# Patient Record
Sex: Female | Born: 1965 | Marital: Single | Smoking: Never smoker
Health system: Southern US, Community
[De-identification: ages and names within clinical notes are randomized; demographics above are authoritative.]

## PROBLEM LIST (undated history)

## (undated) DIAGNOSIS — R011 Cardiac murmur, unspecified: Secondary | ICD-10-CM

## (undated) DIAGNOSIS — I421 Obstructive hypertrophic cardiomyopathy: Secondary | ICD-10-CM

## (undated) DIAGNOSIS — I700 Atherosclerosis of aorta: Secondary | ICD-10-CM

## (undated) DIAGNOSIS — I10 Essential (primary) hypertension: Secondary | ICD-10-CM

## (undated) DIAGNOSIS — K5792 Diverticulitis of intestine, part unspecified, without perforation or abscess without bleeding: Secondary | ICD-10-CM

## (undated) HISTORY — DX: Cardiac murmur, unspecified: R01.1

## (undated) HISTORY — PX: BARTHOLIN CYST MARSUPIALIZATION: SHX5383

## (undated) HISTORY — PX: TUBAL LIGATION: SHX77

## (undated) HISTORY — DX: Atherosclerosis of aorta: I70.0

## (undated) HISTORY — DX: Essential (primary) hypertension: I10

## (undated) HISTORY — DX: Diverticulitis of intestine, part unspecified, without perforation or abscess without bleeding: K57.92

---

## 1997-05-24 ENCOUNTER — Other Ambulatory Visit: Admission: RE | Admit: 1997-05-24 | Discharge: 1997-05-24 | Payer: Self-pay | Admitting: Gynecology

## 1999-07-03 ENCOUNTER — Emergency Department (HOSPITAL_COMMUNITY): Admission: EM | Admit: 1999-07-03 | Discharge: 1999-07-03 | Payer: Self-pay

## 2000-05-11 ENCOUNTER — Inpatient Hospital Stay (HOSPITAL_COMMUNITY): Admission: AD | Admit: 2000-05-11 | Discharge: 2000-05-14 | Payer: Self-pay | Admitting: Gynecology

## 2000-05-11 ENCOUNTER — Encounter (INDEPENDENT_AMBULATORY_CARE_PROVIDER_SITE_OTHER): Payer: Self-pay

## 2000-06-22 ENCOUNTER — Other Ambulatory Visit: Admission: RE | Admit: 2000-06-22 | Discharge: 2000-06-22 | Payer: Self-pay | Admitting: Gynecology

## 2001-07-11 ENCOUNTER — Other Ambulatory Visit: Admission: RE | Admit: 2001-07-11 | Discharge: 2001-07-11 | Payer: Self-pay | Admitting: Gynecology

## 2002-07-20 ENCOUNTER — Other Ambulatory Visit: Admission: RE | Admit: 2002-07-20 | Discharge: 2002-07-20 | Payer: Self-pay | Admitting: Gynecology

## 2002-12-09 ENCOUNTER — Emergency Department (HOSPITAL_COMMUNITY): Admission: EM | Admit: 2002-12-09 | Discharge: 2002-12-09 | Payer: Self-pay | Admitting: Emergency Medicine

## 2003-08-05 ENCOUNTER — Other Ambulatory Visit: Admission: RE | Admit: 2003-08-05 | Discharge: 2003-08-05 | Payer: Self-pay | Admitting: Gynecology

## 2004-08-13 ENCOUNTER — Other Ambulatory Visit: Admission: RE | Admit: 2004-08-13 | Discharge: 2004-08-13 | Payer: Self-pay | Admitting: Gynecology

## 2005-07-08 ENCOUNTER — Emergency Department (HOSPITAL_COMMUNITY): Admission: EM | Admit: 2005-07-08 | Discharge: 2005-07-08 | Payer: Self-pay | Admitting: Family Medicine

## 2005-08-17 ENCOUNTER — Other Ambulatory Visit: Admission: RE | Admit: 2005-08-17 | Discharge: 2005-08-17 | Payer: Self-pay | Admitting: Gynecology

## 2006-08-19 ENCOUNTER — Other Ambulatory Visit: Admission: RE | Admit: 2006-08-19 | Discharge: 2006-08-19 | Payer: Self-pay | Admitting: Gynecology

## 2010-05-29 NOTE — Discharge Summary (Signed)
[facility]  Patient:    [name], Judy                       MRN: [id number] Adm. Date:  [id number] Disc. Date: [id number] Attending:  Tonye Royalty Dictator:   Antony Contras, [facility]                           Discharge Summary  DISCHARGE DIAGNOSES:          1. Intrauterine pregnancy at term.                               2. History of previous cesarean section.                               3. Undesired fertility.  PROCEDURES:                   Repeat low cervical transverse cesarean section                               and tubal sterilization, bilateral Pomeroy                               technique.  HISTORY OF PRESENT ILLNESS:   The patient is a 45 year old gravida 5, para 2-0-2-2, with an LMP of August 13, 1999 and [facility] of May 19, 2000. 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  PRENATAL LABORATORY DATA:     Blood type B positive, antibody screen negative. Sickle cell negative. RPR, HBsAg, HIV nonreactive. Rubella equivocal. MSAFP normal.  HOSPITAL COURSE/TREATMENT:    The patient was admitted on May 11, 2000 for a low cervical transverse cesarean section and bilateral tubal sterilization. It was performed by Dr. Lily Peer assisted by Dr. Audie Box. The patient delivered an Apgar 8/9 female infant weighing 8 pounds 2 ounces.  Postpartum course:  The patient remained afebrile and had no difficulty voiding. She was able to be discharged in satisfactory condition on her third postoperative day. She did receive rubella vaccine prior to discharge. CBC: Hematocrit 30.5, hemoglobin 10.5, WBC 13.8, platelets 252,000.  DISCHARGE FOLLOWUP:           The patient is to follow up in six weeks.  DISCHARGE MEDICATIONS:        The patient is to continue prenatal vitamins and iron with Motrin and Tylox for  pain. DD:  05/30/00 TD:  05/30/00 Job: [id number]

## 2010-05-29 NOTE — H&P (Signed)
[facility]  Patient:    Judy [name], Judy [name]                         MRN: [id number] Adm. Date:  05/11/00 Attending:  Gaetano Hawthorne. Lily Peer, M.D.                         History and Physical  CHIEF COMPLAINT:              1. Term intrauterine pregnancy at 39 weeks as                                  the mean gestational age.                               2. Two previous cesarean sections for elective                                  repeat.                               3. Request for elective permanent sterilization.  HISTORY:                      The patient is a 45 year old gravida 5, para 2, AB2 with an estimated date of confinement of May 29, 2000.  Patient will be 39 weeks estimated gestational age at the time of her planned scheduled cesarean section.  Patient has had two previous cesarean sections in 1991 and 1998 and during her prenatal visit she also had requested to proceed with elective permanent sterilization which she is aware she will not be able to have any more children.  She also had a right Bartholins cyst that was drained during this pregnancy as well.  She also had anemia during this pregnancy and was supplemented with iron.  Otherwise, she had an uneventful prenatal course.  ALLERGIES:                    ANAPROX.  PAST OBSTETRICAL HISTORY:     She has had two previous cesarean sections and also had a spontaneous AB.  She had excision of right Bartholins cyst during this pregnancy.  REVIEW OF SYSTEMS:            See hospital form.  PRENATAL LABORATORIES:        B+ blood type.  Negative antibody screen. Sickle cell trait was negative.  VDRL was nonreactive.  Hepatitis B surface antigen was negative.  HIV was negative.  Rubella was equivocal.  Pap smear was normal.  Alpha fetoprotein was normal.  Blood sugar screen was normal. GBS culture was negative, although she had positive group B strep last pregnancy.  PHYSICAL  EXAMINATION  GENERAL:                      Well-developed, well-nourished female.  HEENT:                        Unremarkable.  NECK:  Supple.  Trachea:  Midline.  No carotid bruits. No thyromegaly.  LUNGS:                        Clear to auscultation without rhonchi or wheezes.  HEART:                        Regular rate and rhythm.  No murmurs or gallops.  BREASTS:                      Done during the first prenatal visit was reported to be normal.  ABDOMEN:                      Gravid uterus.  Fundal height 39.5 cm.  Positive fetal heart tones.  Vertex presentation.  PELVIC:                       Cervix:  Long, closed, posterior.  EXTREMITIES:                  DTRs 1+.  Negative clonus.  ASSESSMENT:                   A 45 year old gravida 5, para 2, AB2 at 31 weeks estimated gestational age with two previous cesarean sections for elective repeat along with elective permanent sterilization.  The patient is fully aware she will not be able to have any more children.  Potential risks of the operation to include infection, bleeding, trauma to internal organs.  All these issues were discussed with the patient.  She is fully aware and would like to proceed with the above mentioned operations.  All questions were answered and will follow accordingly.  PLAN:                         As per assessment above. DD:  05/10/00 TD:  05/10/00 Job: [id number]

## 2010-05-29 NOTE — Op Note (Signed)
[facility]  Patient:    Judy, [name]                       MRN: [id number] Proc. Date: 05/11/00 Adm. Date:  [id number] Attending:  Tonye Royalty                           Operative Report  PREOPERATIVE DIAGNOSES:       1. Term intrauterine pregnancy.                               2. Previous cesarean section.                               3. Request for elective repeat cesarean section.                               4. Request for elective permanent sterilization.  POSTOPERATIVE DIAGNOSES:      1. Term intrauterine pregnancy.                               2. Previous cesarean section.                               3. Request for elective repeat cesarean section.                               4. Request for elective permanent sterilization.  PROCEDURES PERFORMED:         1. Repeat lower uterine segment transverse                                  cesarean section.                               2. Bilateral tubal sterilization procedure,                                  Pomeroy technique.  SURGEON:                      Juan H. Lily Peer, M.D.  ASSISTANTMarcial Pacas P. Fontaine, M.D.  ANESTHESIA:                   Spinal.  INDICATIONS FOR OPERATION:    The patient is a 45 year old gravida 5, para 2, AB2 at 31 weeks estimated gestational age with two previous cesarean sections requesting elective repeat.  Also requests elective permanent sterilization.  DESCRIPTION OF PROCEDURE:     After the patient was adequately counseled, she was taken to the operating room, where she underwent successful spinal placement.  The abdomen was prepped and draped in the usual sterile fashion. A Foley catheter was placed to monitor urinary output.  A Pfannenstiel skin incision  was made adjacent to the previous Pfannenstiel scar.  The incision was carried down through the skin and subcutaneous tissue down to the rectus fascia, whereby a  midline nick was made.  The fascia was incised in a transverse fashion.  The midline raphe was entered.  The peritoneal cavity was entered.  A bladder flap was established.  The lower uterine segment was incised in a transverse fashion.  Clear amniotic fluid was present.  The newborn was delivered.  The cord was doubly clamped and excised.  The nasopharyngeal area was bulb suctioned with a bulb suction.  The newborn gave an immediate cry, was shown to the parents and passed off to the pediatricians who were in attendance, who gave the female infant Apgars of 8 and 9, a weight of 8 lb 2 oz.  Arterial cord pH was 7.28.  Once the cord blood was obtained, the placenta was delivered from the intrauterine cavity.  The uterus was then exteriorized.  The lower uterine segment was closed with a running stitch of 0 Vicryl suture.  Attention was then turned to the proximal portion of the left fallopian tube, where the proximal 1/3 portion was transected after suture ligating it x 2 with 3-0 plain catgut suture.  A similar procedure was carried out on the contralateral side.  The uterus was placed back into the abdominal cavity and, after ascertaining adequate hemostasis, the visceral peritoneum was reapproximated.  The rectus fascia was closed with a running stitch of 0 Vicryl suture.  Subcutaneous bleeders were Bovie cauterized.  The skin was reapproximated with skin clips, followed by placement of Xeroform gauze and 4 x 4 dressing.  The patient was transferred to the recovery room with stable vital signs.  Blood loss for the procedure was 400 cc.  Fluid resuscitation consisted of 3000 cc of lactated Ringers.  Of note, the patient did receive 1 g of Cefotan after the cord had been clamped during the case.  The patient tolerated the procedure well and was transferred to the recovery room with stable vital signs. DD:  05/11/00 TD:  05/11/00 Job: [id number]

## 2010-10-27 ENCOUNTER — Other Ambulatory Visit (HOSPITAL_COMMUNITY)
Admission: RE | Admit: 2010-10-27 | Discharge: 2010-10-27 | Disposition: A | Discharge status: Home / Self Care | Payer: BC Managed Care – PPO | Source: Ambulatory Visit | Attending: Gynecology | Admitting: Gynecology

## 2010-10-27 ENCOUNTER — Ambulatory Visit (INDEPENDENT_AMBULATORY_CARE_PROVIDER_SITE_OTHER): Payer: BC Managed Care – PPO | Admitting: Gynecology

## 2010-10-27 ENCOUNTER — Encounter: Payer: Self-pay | Admitting: Gynecology

## 2010-10-27 VITALS — BP 130/70 | Ht 66.0 in | Wt 220.0 lb

## 2010-10-27 DIAGNOSIS — R823 Hemoglobinuria: Secondary | ICD-10-CM

## 2010-10-27 DIAGNOSIS — Z01419 Encounter for gynecological examination (general) (routine) without abnormal findings: Secondary | ICD-10-CM

## 2010-10-27 DIAGNOSIS — N951 Menopausal and female climacteric states: Secondary | ICD-10-CM

## 2010-10-27 DIAGNOSIS — R3915 Urgency of urination: Secondary | ICD-10-CM

## 2010-10-27 NOTE — Progress Notes (Signed)
Judy [name] March 14, 1965 [id number]        45 y.o.  for annual exam.  Overall doing well has not been in for several years. She does note some mild urgency with urination no incontinence has been going on over the last year or 2. Also notes some occasional night sweats no real hot flashes during the day.  Past medical history,surgical history, medications, allergies, family history and social history were all reviewed and documented in the EPIC chart. ROS:  Was performed and pertinent positives and negatives are included in the history.  Exam: chaperone present Filed Vitals:   10/27/10 1142  BP: 130/70   General appearance  Normal Skin grossly normal Head/Neck normal with no cervical or supraclavicular adenopathy thyroid normal Lungs  clear Cardiac RR, without RMG Abdominal  soft, nontender, without masses, organomegaly or hernia Breasts  examined lying and sitting without masses, retractions, discharge or axillary adenopathy. Pelvic  Ext/BUS/vagina  normal   Cervix  normal  Pap done  Uterus  anteverted, normal size, shape and contour, midline and mobile nontender   Adnexa  Without masses or tenderness    Anus and perineum  normal   Rectovaginal  normal sphincter tone without palpated masses or tenderness.    Assessment/Plan:  45 y.o. female for annual exam.    1. Night sweats. Occasional in nature. We'll check baseline TSH FSH assuming normal patient's to monitor presents as long as they remain stable or resolved we'll follow if they were to worsen then we'll refer to primary for further evaluation. 2. Urgency. Patient has mild urgency symptoms no other symptoms to suggest IC such as dysuria or abdominal discomfort. We'll check UA assuming negative we'll follow up present.  I suggested dietary changes such as avoiding caffeine and carbonated beverages.  I discussed options for medications such as Enablex but she is not interested due to the side effect profile. 3. Health maintenance.  Self breast exams on a monthly basis discussed encourage never had mammo-strongly urged her to schedule this and she agrees to arrange this. She had cholesterol and glucose done at health screening at work. I will check a baseline CBC along with the TSH [facility] and urinalysis. Assuming she continues well from a gynecologic standpoint and she'll see [place] in a year sooner as needed.    Dara Lords MD, 12:20 PM 10/27/2010

## 2010-10-27 NOTE — Progress Notes (Signed)
Addended byCammie Mcgee T on: 10/27/2010 12:43 PM   Modules accepted: Orders

## 2012-04-14 ENCOUNTER — Other Ambulatory Visit (HOSPITAL_COMMUNITY)
Admission: RE | Admit: 2012-04-14 | Discharge: 2012-04-14 | Disposition: A | Discharge status: Home / Self Care | Payer: Self-pay | Source: Ambulatory Visit | Attending: Gynecology | Admitting: Gynecology

## 2012-04-14 ENCOUNTER — Encounter: Payer: Self-pay | Admitting: Gynecology

## 2012-04-14 ENCOUNTER — Ambulatory Visit (INDEPENDENT_AMBULATORY_CARE_PROVIDER_SITE_OTHER): Payer: BC Managed Care – PPO | Admitting: Gynecology

## 2012-04-14 VITALS — BP 130/84 | Ht 66.0 in | Wt 209.0 lb

## 2012-04-14 DIAGNOSIS — Z01419 Encounter for gynecological examination (general) (routine) without abnormal findings: Secondary | ICD-10-CM

## 2012-04-14 DIAGNOSIS — Z1151 Encounter for screening for human papillomavirus (HPV): Secondary | ICD-10-CM | POA: Insufficient documentation

## 2012-04-14 DIAGNOSIS — N951 Menopausal and female climacteric states: Secondary | ICD-10-CM

## 2012-04-14 DIAGNOSIS — Z13220 Encounter for screening for lipoid disorders: Secondary | ICD-10-CM

## 2012-04-14 LAB — CBC WITH DIFFERENTIAL/PLATELET
Basophils Relative: 0 % (ref 0–1)
Eosinophils Absolute: 0.1 10*3/uL (ref 0.0–0.7)
Eosinophils Relative: 2 % (ref 0–5)
Hemoglobin: 11.1 g/dL — ABNORMAL LOW (ref 12.0–15.0)
MCH: 29.2 pg (ref 26.0–34.0)
MCHC: 33 g/dL (ref 30.0–36.0)
Monocytes Absolute: 0.4 10*3/uL (ref 0.1–1.0)
Monocytes Relative: 5 % (ref 3–12)
Neutrophils Relative %: 55 % (ref 43–77)

## 2012-04-14 NOTE — Patient Instructions (Signed)
Blood pressure 130/84  Call to Schedule your mammogram  Facilities in [place]: 1)  [facility] [place], [address] Phone: [phone] 2)  [facility], [id number] Phone: [phone] 3)  Dr. Yolanda Bonine at [facility] [address] Phone: [phone]     Mammogram A mammogram is an X-ray test to find changes in a woman's breast. You should get a mammogram if:  You are 30 years of age or older  You have risk factors.   Your doctor recommends that you have one.  BEFORE THE TEST  Do not schedule the test the week before your period, especially if your breasts are sore during this time.  On the day of your mammogram:  Wash your breasts and armpits well. After washing, do not put on any deodorant or talcum powder on until after your test.   Eat and drink as you usually do.   Take your medicines as usual.   If you are diabetic and take insulin, make sure you:   Eat before coming for your test.   Take your insulin as usual.   If you cannot keep your appointment, call before the appointment to cancel. Schedule another appointment.  TEST  You will need to undress from the waist up. You will put on a hospital gown.   Your breast will be put on the mammogram machine, and it will press firmly on your breast with a piece of plastic called a compression paddle. This will make your breast flatter so that the machine can X-ray all parts of your breast.   Both breasts will be X-rayed. Each breast will be X-rayed from above and from the side. An X-ray might need to be taken again if the picture is not good enough.   The mammogram will last about 15 to 30 minutes.  AFTER THE TEST Finding out the results of your test Ask when your test results will be ready. Make sure you get your test results.  Document Released: 03/26/2008 Document Revised: 12/17/2010 Document Reviewed:  03/26/2008 [facility] Patient Information 2012 [place], [place].

## 2012-04-14 NOTE — Addendum Note (Signed)
Addended by: Dayna Barker on: 04/14/2012 03:22 PM   Modules accepted: Orders

## 2012-04-14 NOTE — Progress Notes (Signed)
Judy [name] [birth year] [id number]        47 y.o.  [id number] for annual exam.  Several issues noted below.  Past medical history,surgical history, medications, allergies, family history and social history were all reviewed and documented in the EPIC chart. ROS:  Was performed and pertinent positives and negatives are included in the history.  Exam: Kim assistant Filed Vitals:   04/14/12 1437  BP: 130/84  Height: 5\' 6"  (1.676 m)  Weight: 209 lb (94.802 kg)   General appearance  Normal Skin grossly normal Head/Neck normal with no cervical or supraclavicular adenopathy thyroid normal Lungs  clear Cardiac RR, without RMG Abdominal  soft, nontender, without masses, organomegaly or hernia Breasts  examined lying and sitting without masses, retractions, discharge or axillary adenopathy. Pelvic  Ext/BUS/vagina  normal   Cervix  normal   Uterus  axial to anteverted, normal size, shape and contour, midline and mobile nontender   Adnexa  Without masses or tenderness    Anus and perineum  normal   Rectovaginal  normal sphincter tone without palpated masses or tenderness.    Assessment/Plan:  47 y.o. [id number] female for annual exam, BTL contraception.   1. Irregular last cycle. Patient's last menses lasted longer with heavier bleeding and cramping. The remainder of her menses have all been normal. Exam today is normal.  We'll check TSH otherwise keep menstrual calendar. As long as her menses continue regular we will monitor. If irregularity continues she will call and we will start with sonohysterogram to rule out intracavitary abnormalities such as polyps and myomas. 2. Menopausal symptoms. Patient has occasional night sweats. Will check TSH. Also old on [facility] if she is having monthly menses and anticipate this will be normal. 3. Pap smear 2012. Pap/HPV done today. No history of abnormal Pap smears previously. If this one is normal then we'll plan less frequent screening interval. 4. Mammography  never. Strongly urged her to schedule mammography. The benefits of early detection reviewed. Patient agrees to schedule. SBE monthly reviewed. 5. Health maintenance. Blood pressure mildly elevated at 130/84. I have asked her to have this rechecked in a non exam situation.  Check baseline CBC comprehensive metabolic panel lipid profile urinalysis along with her TSH. Assuming her menses regulated she will see [place] in a year., Sooner as needed.  Dara Lords MD, 2:58 PM 04/14/2012

## 2012-04-15 LAB — URINALYSIS W MICROSCOPIC + REFLEX CULTURE
Bacteria, UA: NONE SEEN
Casts: NONE SEEN
Hgb urine dipstick: NEGATIVE
Ketones, ur: NEGATIVE mg/dL
Nitrite: NEGATIVE
Urobilinogen, UA: 1 mg/dL (ref 0.0–1.0)

## 2012-04-15 LAB — LIPID PANEL
HDL: 48 mg/dL (ref >39)
LDL Cholesterol: 107 mg/dL — ABNORMAL HIGH (ref 0–99)
Total CHOL/HDL Ratio: 3.5 Ratio
Triglycerides: 53 mg/dL (ref <150)
VLDL: 11 mg/dL (ref 0–40)

## 2012-04-15 LAB — COMPREHENSIVE METABOLIC PANEL
Alkaline Phosphatase: 71 U/L (ref 39–117)
BUN: 10 mg/dL (ref 6–23)
Creat: 0.9 mg/dL (ref 0.50–1.10)
Glucose, Bld: 83 mg/dL (ref 70–99)
Sodium: 138 mEq/L (ref 135–145)
Total Bilirubin: 0.4 mg/dL (ref 0.3–1.2)

## 2012-04-15 LAB — TSH: TSH: 1.756 u[IU]/mL (ref 0.350–4.500)

## 2012-04-17 ENCOUNTER — Other Ambulatory Visit: Payer: Self-pay | Admitting: Gynecology

## 2012-04-17 DIAGNOSIS — D649 Anemia, unspecified: Secondary | ICD-10-CM

## 2012-07-08 ENCOUNTER — Emergency Department (HOSPITAL_BASED_OUTPATIENT_CLINIC_OR_DEPARTMENT_OTHER)
Admission: EM | Admit: 2012-07-08 | Discharge: 2012-07-08 | Disposition: A | Discharge status: Home / Self Care | Payer: BC Managed Care – PPO | Attending: Emergency Medicine | Admitting: Emergency Medicine

## 2012-07-08 ENCOUNTER — Encounter (HOSPITAL_BASED_OUTPATIENT_CLINIC_OR_DEPARTMENT_OTHER): Payer: Self-pay | Admitting: *Deleted

## 2012-07-08 DIAGNOSIS — R5381 Other malaise: Secondary | ICD-10-CM | POA: Insufficient documentation

## 2012-07-08 DIAGNOSIS — J069 Acute upper respiratory infection, unspecified: Secondary | ICD-10-CM | POA: Insufficient documentation

## 2012-07-08 DIAGNOSIS — R6889 Other general symptoms and signs: Secondary | ICD-10-CM | POA: Insufficient documentation

## 2012-07-08 DIAGNOSIS — J3489 Other specified disorders of nose and nasal sinuses: Secondary | ICD-10-CM | POA: Insufficient documentation

## 2012-07-08 DIAGNOSIS — R059 Cough, unspecified: Secondary | ICD-10-CM | POA: Insufficient documentation

## 2012-07-08 DIAGNOSIS — J029 Acute pharyngitis, unspecified: Secondary | ICD-10-CM | POA: Insufficient documentation

## 2012-07-08 DIAGNOSIS — R0982 Postnasal drip: Secondary | ICD-10-CM | POA: Insufficient documentation

## 2012-07-08 DIAGNOSIS — R05 Cough: Secondary | ICD-10-CM | POA: Insufficient documentation

## 2012-07-08 DIAGNOSIS — R51 Headache: Secondary | ICD-10-CM | POA: Insufficient documentation

## 2012-07-08 MED ORDER — HYDROCODONE-ACETAMINOPHEN 7.5-325 MG/15ML PO SOLN: 15.0000 mL | Freq: Four times a day (QID) | ORAL | Status: DC | PRN

## 2012-07-08 MED ORDER — AMOXICILLIN 500 MG PO CAPS: 500.0000 mg | ORAL_CAPSULE | Freq: Three times a day (TID) | ORAL | Status: DC

## 2012-07-08 MED ORDER — HYDROCODONE-ACETAMINOPHEN 7.5-325 MG/15ML PO SOLN
10.0000 mL | Freq: Once | ORAL | Status: AC
  Administered 2012-07-08: 10 mL via ORAL
  Filled 2012-07-08: qty 15

## 2012-07-08 NOTE — ED Notes (Signed)
Sore throat and URI sx since tuesday

## 2012-07-08 NOTE — ED Provider Notes (Signed)
History    CSN: [id number] Arrival date & time 07/08/12  2128  First MD Initiated Contact with Patient 07/08/12 2202     Chief Complaint  Patient presents with  . Sore Throat   (Consider location/radiation/quality/duration/timing/severity/associated sxs/prior Treatment) HPI Judy [name] is a 47 y.o. female who presents to ED with complaint of sore throat. State symptoms began 4 days ago. Reports nasal congestion, headache, sore throat, dry cough. States no fever. States has been taking over the counter cold medication with no improvement. States sore throat is worsening and now it is her main complaint. States painful to swallow. Denies change of voice. States able to still swallow, just hurts. No sick contacts.    History reviewed. No pertinent past medical history. Past Surgical History  Procedure Laterality Date  . Bartholin cyst marsupialization    . Cesarean section      X3 with BTL  . Tubal ligation     Family History  Problem Relation Age of Onset  . Heart disease Mother   . Diabetes Father    History  Substance Use Topics  . Smoking status: Never Smoker   . Smokeless tobacco: Never Used  . Alcohol Use: No   OB History   Grav Para Term Preterm Abortions TAB SAB Ect Mult Living   5 3 3  2  2   3      Review of Systems  Constitutional: Positive for fatigue. Negative for fever and chills.  HENT: Positive for congestion, sore throat, sneezing, postnasal drip and sinus pressure. Negative for trouble swallowing, neck pain, neck stiffness and voice change.   Respiratory: Negative.   Cardiovascular: Negative.   Genitourinary: Negative for dysuria.  Musculoskeletal: Negative.   Skin: Negative for wound.  Neurological: Negative for weakness and headaches.    Allergies  Naproxen sodium  Home Medications  No current outpatient prescriptions on file. BP 159/77  Pulse 77  Temp(Src) 98.7 F (37.1 C) (Oral)  Ht 5\' 7"  (1.702 m)  Wt 210 lb (95.255 kg)  BMI 32.88  kg/m2  SpO2 100%  LMP 06/29/2012 Physical Exam  Nursing note and vitals reviewed. Constitutional: She appears well-developed and well-nourished. No distress.  HENT:  Head: Normocephalic.  Right Ear: Tympanic membrane, external ear and ear canal normal.  Left Ear: Tympanic membrane, external ear and ear canal normal.  Nose: Rhinorrhea present. Right sinus exhibits no maxillary sinus tenderness and no frontal sinus tenderness. Left sinus exhibits no maxillary sinus tenderness and no frontal sinus tenderness.  Mouth/Throat: Uvula is midline and mucous membranes are normal. Posterior oropharyngeal erythema present. No oropharyngeal exudate.  Eyes: Conjunctivae are normal.  Neck: Neck supple.  Cardiovascular: Normal rate, regular rhythm and normal heart sounds.   Pulmonary/Chest: Effort normal and breath sounds normal. No respiratory distress. She has no wheezes. She has no rales.  Abdominal: Soft. Bowel sounds are normal. She exhibits no distension. There is no tenderness. There is no rebound.  Musculoskeletal: She exhibits no edema.  Neurological: She is alert.  Skin: Skin is warm and dry.  Psychiatric: She has a normal mood and affect. Her behavior is normal.    ED Course  Procedures (including critical care time) Results for orders placed during the hospital encounter of 07/08/12  RAPID STREP SCREEN      Result Value Range   Streptococcus, Group A Screen (Direct) NEGATIVE  NEGATIVE   No results found.   1. URI (upper respiratory infection)   2. Pharyngitis     MDM  Pt with URI symptoms and worsening sore throat. Strep negative. Suspect viral URI. Pt requesting antibiotics, will give prescription, explained most likely not going to help if viral. Advised to hold off for few days. Will treat symptomatically. Pt's oxygen sat 100% on RA, lungs clear, doubt pneumonia. No significant tonsillar swelling or exudate. Doubt bacterial pharyngitis. No signs of peritonsillar absess.   Filed  Vitals:   07/08/12 2146  BP: 159/77  Pulse: 77  Temp: 98.7 F (37.1 C)  TempSrc: Oral  Height: 5\' 7"  (1.702 m)  Weight: 210 lb (95.255 kg)  SpO2: 100%     Myriam Jacobson Pang Robers, PA-C 07/09/12 0033

## 2012-07-08 NOTE — ED Notes (Signed)
C/o sore throat and bilat. ear pain 7/10

## 2012-07-09 NOTE — ED Provider Notes (Signed)
History/physical exam/procedure(s) were performed by non-physician practitioner and as supervising physician I was immediately available for consultation/collaboration. I have reviewed all notes and am in agreement with care and plan.   Lun Muro S Zannah Melucci, MD 07/09/12 1543 

## 2012-07-10 LAB — CULTURE, GROUP A STREP

## 2013-01-25 ENCOUNTER — Ambulatory Visit (INDEPENDENT_AMBULATORY_CARE_PROVIDER_SITE_OTHER): Payer: BC Managed Care – PPO | Admitting: Gynecology

## 2013-01-25 ENCOUNTER — Encounter: Payer: Self-pay | Admitting: Gynecology

## 2013-01-25 ENCOUNTER — Telehealth: Payer: Self-pay

## 2013-01-25 DIAGNOSIS — N926 Irregular menstruation, unspecified: Secondary | ICD-10-CM

## 2013-01-25 DIAGNOSIS — N920 Excessive and frequent menstruation with regular cycle: Secondary | ICD-10-CM

## 2013-01-25 LAB — CBC WITH DIFFERENTIAL/PLATELET
BASOS ABS: 0 10*3/uL (ref 0.0–0.1)
Basophils Relative: 1 % (ref 0–1)
Eosinophils Absolute: 0.2 10*3/uL (ref 0.0–0.7)
Eosinophils Relative: 3 % (ref 0–5)
HEMATOCRIT: 31.9 % — AB (ref 36.0–46.0)
Hemoglobin: 10.7 g/dL — ABNORMAL LOW (ref 12.0–15.0)
LYMPHS ABS: 3.1 10*3/uL (ref 0.7–4.0)
LYMPHS PCT: 41 % (ref 12–46)
MCH: 29.8 pg (ref 26.0–34.0)
MCHC: 33.5 g/dL (ref 30.0–36.0)
MCV: 88.9 fL (ref 78.0–100.0)
Monocytes Absolute: 0.4 10*3/uL (ref 0.1–1.0)
Monocytes Relative: 5 % (ref 3–12)
NEUTROS ABS: 3.8 10*3/uL (ref 1.7–7.7)
Neutrophils Relative %: 50 % (ref 43–77)
PLATELETS: 377 10*3/uL (ref 150–400)
RBC: 3.59 MIL/uL — AB (ref 3.87–5.11)
RDW: 16.5 % — AB (ref 11.5–15.5)
WBC: 7.6 10*3/uL (ref 4.0–10.5)

## 2013-01-25 LAB — FOLLICLE STIMULATING HORMONE: FSH: 10.6 m[IU]/mL

## 2013-01-25 LAB — HCG, SERUM, QUALITATIVE: PREG SERUM: NEGATIVE

## 2013-01-25 MED ORDER — MEGESTROL ACETATE 20 MG PO TABS: 20.0000 mg | ORAL_TABLET | Freq: Every day | ORAL | Status: DC

## 2013-01-25 NOTE — Patient Instructions (Signed)
Followup for ultrasound as scheduled. Take Megace medication 2-3 times daily until bleeding almost gone them once daily through ultrasound appointment.

## 2013-01-25 NOTE — Telephone Encounter (Signed)
Patient called c/o period going on little over a week and has gotten extremely heavy with clots. She said it is so heavy she cannot leave the house of go anywhere. I told her that she would need to come for exam to assess problem.  Transferred to appt desk and I relayed to Rosemarie Ax that we should see her this afternoon.

## 2013-01-25 NOTE — Progress Notes (Signed)
Patient presents having had regular menses through last LNMP end of December. Started bleeding a week ago it has bled on and off to include heavy bleeding with clots and cramping. Menses were regular for months preceding.  History of tubal sterilization.  Exam with Maudie Mercury Assistant Abdomen soft nontender without masses guarding rebound organomegaly. Pelvic external BUS vagina with moderate menses flow. Cervix normal. Uterus grossly normal [place] nontender. Adnexa without masses or tenderness.  Assessment and plan: Irregular heavy menses. Check baseline labs to include CBC FSH TSH hCG. Schedule sonohysterogram to rule out structural abnormalities. Start on Megace 20 mg twice a day to 3 times a day until bleeding slowed then once daily through ultrasound appointment. Various scenarios reviewed with the patient and we'll rediscuss after lab work and ultrasound.

## 2013-01-26 LAB — TSH: TSH: 1.52 u[IU]/mL (ref 0.350–4.500)

## 2013-01-29 ENCOUNTER — Other Ambulatory Visit: Payer: Self-pay | Admitting: Gynecology

## 2013-01-29 DIAGNOSIS — N920 Excessive and frequent menstruation with regular cycle: Secondary | ICD-10-CM

## 2013-01-29 DIAGNOSIS — N926 Irregular menstruation, unspecified: Secondary | ICD-10-CM

## 2013-02-14 ENCOUNTER — Other Ambulatory Visit: Payer: BC Managed Care – PPO

## 2013-02-14 ENCOUNTER — Ambulatory Visit: Payer: BC Managed Care – PPO | Admitting: Gynecology

## 2013-03-02 ENCOUNTER — Ambulatory Visit (INDEPENDENT_AMBULATORY_CARE_PROVIDER_SITE_OTHER): Payer: BC Managed Care – PPO

## 2013-03-02 ENCOUNTER — Ambulatory Visit (INDEPENDENT_AMBULATORY_CARE_PROVIDER_SITE_OTHER): Payer: BC Managed Care – PPO | Admitting: Gynecology

## 2013-03-02 ENCOUNTER — Encounter: Payer: Self-pay | Admitting: Gynecology

## 2013-03-02 DIAGNOSIS — N920 Excessive and frequent menstruation with regular cycle: Secondary | ICD-10-CM

## 2013-03-02 DIAGNOSIS — N926 Irregular menstruation, unspecified: Secondary | ICD-10-CM

## 2013-03-02 DIAGNOSIS — D251 Intramural leiomyoma of uterus: Secondary | ICD-10-CM

## 2013-03-02 MED ORDER — MEGESTROL ACETATE 20 MG PO TABS: 20.0000 mg | ORAL_TABLET | Freq: Every day | ORAL | Status: DC

## 2013-03-02 NOTE — Patient Instructions (Signed)
Office will contact you to schedule surgery.

## 2013-03-02 NOTE — Progress Notes (Signed)
Patient presents for sonohysterogram due to recent onset of irregular prolonged bleeding. Had regular menses through end of December and started bleeding on and off. Was treated with a course of Megace which stopped her bleeding but now she is bleeding daily not heavily but on the spotting type nature despite taking her Megace. History of BTL. CBC showed anemia hemoglobin 10.7 hCG TSH and FSH were normal.  Ultrasound shows uterus generous in size with endometrial echo 23 mm. Multiple small myomas noted largest 28 mm. Right ovary normal. 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  Sonohysterogram performed, sterile technique, easy catheter introduction, difficult distention with probable endometrial polyp 21 x 18 mm within the cavity. Difficult to clearly define the outline of the polyp due to the poor distention and debris within the cavity. Endometrial sample was taken. Patient tolerated well.  Assessment and plan: Persistent irregular bleeding with multiple small myomas. The endometrial cavity with probable endometrial polyp difficult to visualize with sonohysterogram. Patient will followup for the biopsy results. Recommended hysteroscopic evaluation and removal. I reviewed the proposed surgery to include expected intraoperative and postoperative courses as well as the recovery period. Instrumentation to include the hysteroscope D&C portion and resectoscope reviewed. Risks of infection, prolonged antibiotics, hemorrhage necessitating transfusion and the risks of transfusion, uterine perforation and risks of internal organ damage such as bowel, bladder, ureters, nerves necessitating major reparative surgeries and future reparative surgeries to include bowel resection, ostomy formation, bladder and ureter repair surgeries. Distended media absorption leading to metabolic complications such as fluid overload, coma and seizures were also reviewed. No guarantees that this will relieve  her bleeding or that there is a definite polyp given the difficulty visualizing with the sonohysterogram. Patient will schedule at her convenience and stay on Megace 20 mg daily for the surgery.

## 2013-03-13 ENCOUNTER — Telehealth: Payer: Self-pay

## 2013-03-13 NOTE — Telephone Encounter (Signed)
Okay to schedule and I will send you schedule slip. Short preop appointment will be needed.

## 2013-03-13 NOTE — Telephone Encounter (Signed)
Patient called. Ready to schedule surgery.  Hysteroscopy, D&C Resection of Myoma??

## 2013-03-14 ENCOUNTER — Other Ambulatory Visit: Payer: Self-pay | Admitting: Gynecology

## 2013-03-14 MED ORDER — MISOPROSTOL 200 MCG PO TABS: ORAL_TABLET | ORAL | Status: DC

## 2013-03-14 NOTE — Telephone Encounter (Signed)
Surgery scheduled for 04/12/13 7:30am at [facility].  Pre op appt with Dr. Loetta Rough was set for 04/05/13 at 9:30am.  Patient was instructed regarding Cytotec tab and to insert it vaginally hs before surgery.  She will expect to hear from [facility].

## 2013-04-02 ENCOUNTER — Encounter (HOSPITAL_COMMUNITY): Payer: Self-pay | Admitting: *Deleted

## 2013-04-05 ENCOUNTER — Encounter: Payer: Self-pay | Admitting: Gynecology

## 2013-04-05 ENCOUNTER — Ambulatory Visit (INDEPENDENT_AMBULATORY_CARE_PROVIDER_SITE_OTHER): Payer: BC Managed Care – PPO | Admitting: Gynecology

## 2013-04-05 DIAGNOSIS — N920 Excessive and frequent menstruation with regular cycle: Secondary | ICD-10-CM

## 2013-04-05 DIAGNOSIS — N926 Irregular menstruation, unspecified: Secondary | ICD-10-CM

## 2013-04-05 NOTE — H&P (Signed)
  Judy [name] [birth year] [id number]   History and Physical  Chief complaint: Irregular heavy menses  History of present illness: 48 y.o. [id number] status post BTL. History of regular menses through December when she started to bleed on and off on a continuous basis. Was treated with a course of Megace but then resumed her daily bleeding. CBC hemoglobin 10.7. HCG TSH FSH normal. Sonohysterogram shows generous uterus with multiple small myomas largest measuring 28 mm. Endometrial echo 23 mm. Uterine distention difficult with debris within the cavity and questionable polyp. Endometrial sample showed exogenous hormonal effect with breakdown. The patient is admitted for hysteroscopy D&C for better evaluation of the endometrial cavity.  Past medical history,surgical history, medications, allergies, family history and social history were all reviewed and documented in the EPIC chart. ROS:  Was performed and pertinent positives and negatives are included in the history of present illness.  Exam:  Kim assistant General: well developed, well nourished female, no acute distress HEENT: normal  Lungs: clear to auscultation without wheezing, rales or rhonchi  Cardiac: regular rate without rubs, murmurs or gallops  Abdomen: soft, nontender without masses, guarding, rebound, organomegaly  Pelvic: external bus vagina: normal   Cervix: grossly normal  Uterus: normal size, midline and mobile, nontender  Adnexa: without masses or tenderness      Assessment/Plan:  48 y.o. [id number] with history as above for hysteroscopy D&C.  I reviewed the proposed surgery to include the expected intraoperative and postoperative courses as well as the recovery period. Instrumentation to include the hysteroscope, D&C portion and resectoscope reviewed. Risks of infection, prolonged antibiotics, hemorrhage necessitating transfusion and the risks of transfusion including transfusion reaction, hepatitis, HIV, mad cow disease and other  unknown entities.  Uterine perforation and risks of internal organ damage such as bowel, bladder, ureters, vessels and nerves necessitating major reparative surgeries and future reparative surgeries to include bowel resection, ostomy formation, bladder and ureter repair surgeries. Distended media absorption leading to metabolic complications such as fluid overload, coma and seizures were also reviewed. No guarantees that this will relieve her bleeding or that there is a definite polyp given the difficulty visualizing with the sonohysterogram.  She has multiple small myomas and these may be contributing to her irregular bleeding which will not be corrected with the hysteroscopy D&C. The patient's questions were answered to her satisfaction she is ready to proceed with surgery. She will use Cytotec intravaginal might be for the surgery and was given instructions for use.   Note: This document was prepared with digital dictation and possible smart phrase technology. Any transcriptional errors that result from this process are unintentional.  Anastasio Auerbach MD, 9:56 AM 04/05/2013

## 2013-04-05 NOTE — Patient Instructions (Addendum)
Followup for surgery as scheduled. 

## 2013-04-05 NOTE — Progress Notes (Signed)
MAROLYN URSCHEL [birth year] [id number]   Preoperative consult  Chief complaint: Irregular heavy menses  History of present illness: 48 y.o. [id number] status post BTL. History of regular menses through December when she started to bleed on and off on a continuous basis. Was treated with a course of Megace but then resumed her daily bleeding. CBC hemoglobin 10.7. HCG TSH FSH normal. Sonohysterogram shows generous uterus with multiple small myomas largest measuring 28 mm. Endometrial echo 23 mm. Uterine distention difficult with debris within the cavity and questionable polyp. Endometrial sample showed exogenous hormonal effect with breakdown. The patient is admitted for hysteroscopy D&C for better evaluation of the endometrial cavity.  Past medical history,surgical history, medications, allergies, family history and social history were all reviewed and documented in the EPIC chart. ROS:  Was performed and pertinent positives and negatives are included in the history of present illness.  Exam:  Kim assistant General: well developed, well nourished female, no acute distress HEENT: normal  Lungs: clear to auscultation without wheezing, rales or rhonchi  Cardiac: regular rate without rubs, murmurs or gallops  Abdomen: soft, nontender without masses, guarding, rebound, organomegaly  Pelvic: external bus vagina: normal   Cervix: grossly normal  Uterus: normal size, midline and mobile, nontender  Adnexa: without masses or tenderness      Assessment/Plan:  48 y.o. [id number] with history as above for hysteroscopy D&C.  I reviewed the proposed surgery to include the expected intraoperative and postoperative courses as well as the recovery period. Instrumentation to include the hysteroscope, D&C portion and resectoscope reviewed. Risks of infection, prolonged antibiotics, hemorrhage necessitating transfusion and the risks of transfusion including transfusion reaction, hepatitis, HIV, mad cow disease and other  unknown entities.  Uterine perforation and risks of internal organ damage such as bowel, bladder, ureters, vessels and nerves necessitating major reparative surgeries and future reparative surgeries to include bowel resection, ostomy formation, bladder and ureter repair surgeries. Distended media absorption leading to metabolic complications such as fluid overload, coma and seizures were also reviewed. No guarantees that this will relieve her bleeding or that there is a definite polyp given the difficulty visualizing with the sonohysterogram.  She has multiple small myomas and these may be contributing to her irregular bleeding which will not be corrected with the hysteroscopy D&C. The patient's questions were answered to her satisfaction she is ready to proceed with surgery. She will use Cytotec intravaginal might be for the surgery and was given instructions for use.    Note: This document was prepared with digital dictation and possible smart phrase technology. Any transcriptional errors that result from this process are unintentional.  Anastasio Auerbach MD, 9:50 AM 04/05/2013

## 2013-04-11 MED ORDER — DEXTROSE 5 % IV SOLN
2.0000 g | INTRAVENOUS | Status: AC
  Administered 2013-04-12: 2 g via INTRAVENOUS
  Filled 2013-04-11: qty 2

## 2013-04-12 ENCOUNTER — Ambulatory Visit (HOSPITAL_COMMUNITY)
Admission: RE | Admit: 2013-04-12 | Discharge: 2013-04-12 | Disposition: A | Discharge status: Home / Self Care | Payer: BC Managed Care – PPO | Source: Ambulatory Visit | Attending: Gynecology | Admitting: Gynecology

## 2013-04-12 ENCOUNTER — Encounter (HOSPITAL_COMMUNITY)
Admission: RE | Disposition: A | Discharge status: Home / Self Care | Payer: Self-pay | Source: Ambulatory Visit | Attending: Gynecology

## 2013-04-12 ENCOUNTER — Encounter (HOSPITAL_COMMUNITY): Payer: BC Managed Care – PPO | Admitting: Anesthesiology

## 2013-04-12 ENCOUNTER — Encounter (HOSPITAL_COMMUNITY): Payer: Self-pay | Admitting: *Deleted

## 2013-04-12 ENCOUNTER — Ambulatory Visit (HOSPITAL_COMMUNITY): Payer: BC Managed Care – PPO | Admitting: Anesthesiology

## 2013-04-12 DIAGNOSIS — N926 Irregular menstruation, unspecified: Secondary | ICD-10-CM | POA: Insufficient documentation

## 2013-04-12 DIAGNOSIS — N939 Abnormal uterine and vaginal bleeding, unspecified: Secondary | ICD-10-CM

## 2013-04-12 DIAGNOSIS — N920 Excessive and frequent menstruation with regular cycle: Secondary | ICD-10-CM

## 2013-04-12 HISTORY — PX: DILATATION & CURETTAGE/HYSTEROSCOPY WITH TRUECLEAR: SHX6353

## 2013-04-12 LAB — CBC
HEMATOCRIT: 37.2 % (ref 36.0–46.0)
HEMOGLOBIN: 12.2 g/dL (ref 12.0–15.0)
MCH: 29.8 pg (ref 26.0–34.0)
MCHC: 32.8 g/dL (ref 30.0–36.0)
MCV: 91 fL (ref 78.0–100.0)
Platelets: 354 10*3/uL (ref 150–400)
RBC: 4.09 MIL/uL (ref 3.87–5.11)
RDW: 14.9 % (ref 11.5–15.5)
WBC: 6.5 10*3/uL (ref 4.0–10.5)

## 2013-04-12 LAB — PREGNANCY, URINE: PREG TEST UR: NEGATIVE

## 2013-04-12 SURGERY — DILATATION & CURETTAGE/HYSTEROSCOPY WITH TRUCLEAR
Anesthesia: General | Site: Abdomen

## 2013-04-12 MED ORDER — FENTANYL CITRATE 0.05 MG/ML IJ SOLN
INTRAMUSCULAR | Status: DC | PRN
  Administered 2013-04-12 (×2): 50 ug via INTRAVENOUS

## 2013-04-12 MED ORDER — FENTANYL CITRATE 0.05 MG/ML IJ SOLN
INTRAMUSCULAR | Status: AC
  Filled 2013-04-12: qty 2

## 2013-04-12 MED ORDER — KETOROLAC TROMETHAMINE 30 MG/ML IJ SOLN
INTRAMUSCULAR | Status: AC
  Filled 2013-04-12: qty 1

## 2013-04-12 MED ORDER — ONDANSETRON HCL 4 MG/2ML IJ SOLN
INTRAMUSCULAR | Status: DC | PRN
  Administered 2013-04-12: 4 mg via INTRAVENOUS

## 2013-04-12 MED ORDER — METOCLOPRAMIDE HCL 5 MG/ML IJ SOLN
10.0000 mg | Freq: Once | INTRAMUSCULAR | Status: DC | PRN
Start: 2013-04-12 — End: 2013-04-12

## 2013-04-12 MED ORDER — FENTANYL CITRATE 0.05 MG/ML IJ SOLN
INTRAMUSCULAR | Status: AC
  Administered 2013-04-12: 50 ug via INTRAVENOUS
  Filled 2013-04-12: qty 2

## 2013-04-12 MED ORDER — LIDOCAINE HCL 1 % IJ SOLN
INTRAMUSCULAR | Status: AC
  Filled 2013-04-12: qty 20

## 2013-04-12 MED ORDER — MIDAZOLAM HCL 2 MG/2ML IJ SOLN
INTRAMUSCULAR | Status: AC
  Filled 2013-04-12: qty 2

## 2013-04-12 MED ORDER — OXYCODONE-ACETAMINOPHEN 5-325 MG PO TABS: 1.0000 | ORAL_TABLET | ORAL | Status: DC | PRN

## 2013-04-12 MED ORDER — KETOROLAC TROMETHAMINE 30 MG/ML IJ SOLN
INTRAMUSCULAR | Status: DC | PRN
  Administered 2013-04-12: 30 mg via INTRAVENOUS

## 2013-04-12 MED ORDER — MEPERIDINE HCL 25 MG/ML IJ SOLN: 6.2500 mg | INTRAMUSCULAR | Status: DC | PRN

## 2013-04-12 MED ORDER — MIDAZOLAM HCL 2 MG/2ML IJ SOLN
INTRAMUSCULAR | Status: DC | PRN
  Administered 2013-04-12: 2 mg via INTRAVENOUS

## 2013-04-12 MED ORDER — LIDOCAINE HCL (CARDIAC) 20 MG/ML IV SOLN
INTRAVENOUS | Status: DC | PRN
  Administered 2013-04-12: 30 mg via INTRAVENOUS
  Administered 2013-04-12: 70 mg via INTRAVENOUS

## 2013-04-12 MED ORDER — LACTATED RINGERS IV SOLN
INTRAVENOUS | Status: DC
  Administered 2013-04-12: 07:00:00 via INTRAVENOUS

## 2013-04-12 MED ORDER — ONDANSETRON HCL 4 MG/2ML IJ SOLN
INTRAMUSCULAR | Status: AC
  Filled 2013-04-12: qty 2

## 2013-04-12 MED ORDER — FENTANYL CITRATE 0.05 MG/ML IJ SOLN
25.0000 ug | INTRAMUSCULAR | Status: DC | PRN
  Administered 2013-04-12: 50 ug via INTRAVENOUS

## 2013-04-12 MED ORDER — LIDOCAINE HCL (CARDIAC) 20 MG/ML IV SOLN
INTRAVENOUS | Status: AC
  Filled 2013-04-12: qty 5

## 2013-04-12 MED ORDER — DEXAMETHASONE SODIUM PHOSPHATE 10 MG/ML IJ SOLN
INTRAMUSCULAR | Status: AC
  Filled 2013-04-12: qty 1

## 2013-04-12 MED ORDER — DEXAMETHASONE SODIUM PHOSPHATE 10 MG/ML IJ SOLN
INTRAMUSCULAR | Status: DC | PRN
  Administered 2013-04-12: 10 mg via INTRAVENOUS

## 2013-04-12 MED ORDER — PROPOFOL 10 MG/ML IV BOLUS
INTRAVENOUS | Status: DC | PRN
  Administered 2013-04-12: 180 mg via INTRAVENOUS

## 2013-04-12 MED ORDER — LIDOCAINE HCL 1 % IJ SOLN
INTRAMUSCULAR | Status: DC | PRN
  Administered 2013-04-12: 10 mL

## 2013-04-12 MED ORDER — PROPOFOL 10 MG/ML IV EMUL
INTRAVENOUS | Status: AC
  Filled 2013-04-12: qty 20

## 2013-04-12 SURGICAL SUPPLY — 20 items
BLADE INCISOR TRUC PLUS 2.9 (ABLATOR) IMPLANT
CANISTERS HI-FLOW 3000CC (CANNISTER) IMPLANT
CATH ROBINSON RED A/P 16FR (CATHETERS) ×3 IMPLANT
CLOTH BEACON ORANGE TIMEOUT ST (SAFETY) ×3 IMPLANT
CONTAINER PREFILL 10% NBF 60ML (FORM) ×6 IMPLANT
DRAPE HYSTEROSCOPY (DRAPE) ×3 IMPLANT
DRSG TELFA 3X8 NADH (GAUZE/BANDAGES/DRESSINGS) ×3 IMPLANT
GLOVE BIO SURGEON STRL SZ7.5 (GLOVE) ×3 IMPLANT
GOWN STRL REUS W/TWL LRG LVL3 (GOWN DISPOSABLE) ×6 IMPLANT
INCISOR TRUC PLUS BLADE 2.9 (ABLATOR)
KIT HYSTEROSCOPY TRUCLEAR (ABLATOR) IMPLANT
MORCELLATOR RECIP TRUCLEAR 4.0 (ABLATOR) IMPLANT
NDL SPNL 22GX3.5 QUINCKE BK (NEEDLE) IMPLANT
NEEDLE SPNL 22GX3.5 QUINCKE BK (NEEDLE) IMPLANT
PACK VAGINAL MINOR WOMEN LF (CUSTOM PROCEDURE TRAY) ×3 IMPLANT
PAD DRESSING TELFA 3X8 NADH (GAUZE/BANDAGES/DRESSINGS) ×1 IMPLANT
PAD OB MATERNITY 4.3X12.25 (PERSONAL CARE ITEMS) ×3 IMPLANT
SYR CONTROL 10ML LL (SYRINGE) ×3 IMPLANT
TOWEL OR 17X24 6PK STRL BLUE (TOWEL DISPOSABLE) ×6 IMPLANT
WATER STERILE IRR 1000ML POUR (IV SOLUTION) ×3 IMPLANT

## 2013-04-12 NOTE — Transfer of Care (Signed)
Immediate Anesthesia Transfer of Care Note  Patient: Judy [name]  Procedure(s) Performed: Procedure(s): DILATATION & CURETTAGE/HYSTEROSCOPY  (N/A)  Patient Location: PACU  Anesthesia Type:General  Level of Consciousness: awake, oriented and patient cooperative  Airway & Oxygen Therapy: Patient Spontanous Breathing and Patient connected to nasal cannula oxygen  Post-op Assessment: Report given to PACU RN and Post -op Vital signs reviewed and stable  Post vital signs: Reviewed and stable  Complications: No apparent anesthesia complications

## 2013-04-12 NOTE — H&P (Signed)
  The patient was examined.  I reviewed the proposed surgery and consent form with the patient.  The dictated history and physical is current and accurate and all questions were answered. The patient is ready to proceed with surgery and has a realistic understanding and expectation for the outcome.   Anastasio Auerbach MD, 7:10 AM 04/12/2013

## 2013-04-12 NOTE — Anesthesia Postprocedure Evaluation (Signed)
  Anesthesia Post-op Note  Patient: Judy [name]  Procedure(s) Performed: Procedure(s): DILATATION & CURETTAGE/HYSTEROSCOPY  (N/A)  Patient Location: PACU  Anesthesia Type:General  Level of Consciousness: awake, alert  and oriented  Airway and Oxygen Therapy: Patient Spontanous Breathing  Post-op Pain: none  Post-op Assessment: Post-op Vital signs reviewed, Patient's Cardiovascular Status Stable, Respiratory Function Stable, Patent Airway, No signs of Nausea or vomiting and Pain level controlled  Post-op Vital Signs: Reviewed and stable  Complications: No apparent anesthesia complications

## 2013-04-12 NOTE — Op Note (Signed)
Judy [name] 1965/10/09 [id number]   Post Operative Note   Date of surgery:  04/12/2013  Pre Op Dx:  Menorrhagia, irregular menses  Post Op Dx:  Menorrhagia, irregular menses  Procedure:  Hysteroscopy D&C  Surgeon:  Donalynn Furlong P  Anesthesia:  General  EBL:  Minimal  Distended media discrepancy:  50 cc saline  Complications:  None  Specimen:  Endometrial curetting to pathology  Findings: EUA:  External BUS vagina normal. Cervix normal. Uterus anteverted normal size midline mobile. Adnexa without masses   Hysteroscopy: Several clots within the endometrial cavity. After clearing the endometrial cavity was normal to atrophic in appearance. No polyps or submucous myomas noted. Fundus, anterior/posterior uterine surfaces, lower uterine segment, endocervical canal, left tubal ostia all visualized. Right tubal ostia not visualized but indentation noted in the endometrium at expected location.  Procedure:  The patient was taken to the operating room, underwent general anesthesia, was placed in the low dorsal lithotomy position, received a perineal/vaginal preparation with Betadine solution, bladder emptied with an in and out Foley catheterization and an EUA was performed.  A time out was performed by the surgical team. The patient was then draped in the usual fashion and the cervix was visualized with a speculum, anterior lip grasped with a single-tooth tenaculum and a paracervical block was placed using 1% Xylocaine, 10 cc total. The cervix was gently dilated to admit the Truclear 5.0 hysteroscope and hysteroscopy was performed with findings noted above. A gentle sharp curettage was performed and the specimen was sent to pathology. Subsequent hysteroscopy showed an empty cavity with good distention and no evidence of perforation. The instruments were removed, hemostasis visualized at the tenaculum site and external os, the patient received intraoperative Toradol, placed in the supine position  and was awakened without difficulty and taken to recovery in good condition having tolerated the procedure well.   Note: This document was prepared with digital dictation and possible smart phrase technology. Any transcriptional errors that result from this process are unintentional.  Anastasio Auerbach MD, 8:25 AM 04/12/2013

## 2013-04-12 NOTE — Anesthesia Preprocedure Evaluation (Signed)
Anesthesia Evaluation  Patient identified by MRN, date of birth, ID band Patient awake    Reviewed: Allergy & Precautions, H&P , NPO status , Patient's Chart, lab work & pertinent test results  Airway Mallampati: II TM Distance: >3 FB Neck ROM: Full    Dental no notable dental hx. (+) Teeth Intact   Pulmonary neg pulmonary ROS,  breath sounds clear to auscultation  Pulmonary exam normal       Cardiovascular negative cardio ROS  Rhythm:Regular Rate:Normal     Neuro/Psych negative neurological ROS  negative psych ROS   GI/Hepatic negative GI ROS, Neg liver ROS,   Endo/Other  negative endocrine ROS  Renal/GU negative Renal ROS  negative genitourinary   Musculoskeletal negative musculoskeletal ROS (+)   Abdominal (+) + obese,   Peds  Hematology negative hematology ROS (+)   Anesthesia Other Findings   Reproductive/Obstetrics Menorrhagia Endometrial polyp                           Anesthesia Physical Anesthesia Plan  ASA: II  Anesthesia Plan: General   Post-op Pain Management:    Induction: Intravenous  Airway Management Planned: LMA  Additional Equipment:   Intra-op Plan:   Post-operative Plan: Extubation in OR  Informed Consent: I have reviewed the patients History and Physical, chart, labs and discussed the procedure including the risks, benefits and alternatives for the proposed anesthesia with the patient or authorized representative who has indicated his/her understanding and acceptance.   Dental advisory given  Plan Discussed with: CRNA, Anesthesiologist and Surgeon  Anesthesia Plan Comments:         Anesthesia Quick Evaluation

## 2013-04-12 NOTE — Discharge Instructions (Signed)
° °  Postoperative Instructions Hysteroscopy D & C ° °Dr. Rhylin Venters and the nursing staff have discussed postoperative instructions with you.  If you have any questions please ask them before you leave the hospital, or call Dr Brandolyn Shortridge’s office at [phone].   ° °We would like to emphasize the following instructions: ° ° °? Call the office to make your follow-up appointment as recommended by Dr Zaide Mcclenahan (usually 1-2 weeks). ° °? You were given a prescription, or one was ordered for you at the pharmacy you designated.  Get that prescription filled and take the medication according to instructions. ° °? You may eat a regular diet, but slowly until you start having bowel movements. ° °? Drink plenty of water daily. ° °? Nothing in the vagina (intercourse, douching, objects of any kind) for two weeks.  When reinitiating intercourse, if it is uncomfortable, stop and make an appointment with Dr Islah Eve to be evaluated. ° °? No driving for one to two days until the effects of anesthesia has worn off.  No traveling out of town for several days. ° °? You may shower, but no baths for one week.  Walking up and down stairs is ok.  No heavy lifting, prolonged standing, repeated bending or any “working out” until your post op check. ° °? Rest frequently, listen to your body and do not push yourself and overdo it. ° °? Call if: ° °o Your pain medication does not seem strong enough. °o Worsening pain or abdominal bloating °o Persistent nausea or vomiting °o Difficulty with urination or bowel movements. °o Temperature of 101 degrees or higher. °o Heavy vaginal bleeding.  If your period is due, you may use tampons. °o You have any questions or concerns ° ° ° °

## 2013-04-13 ENCOUNTER — Encounter (HOSPITAL_COMMUNITY): Payer: Self-pay | Admitting: Gynecology

## 2013-05-02 ENCOUNTER — Encounter: Payer: Self-pay | Admitting: Gynecology

## 2013-05-02 ENCOUNTER — Ambulatory Visit (INDEPENDENT_AMBULATORY_CARE_PROVIDER_SITE_OTHER): Payer: BC Managed Care – PPO | Admitting: Gynecology

## 2013-05-02 DIAGNOSIS — Z9889 Other specified postprocedural states: Secondary | ICD-10-CM

## 2013-05-02 NOTE — Progress Notes (Signed)
Judy [name] [birth year] [id number]        48 y.o.  [name] presents for her postoperative checkup status post hysteroscopy D&C for irregular bleeding. She has done well since then without bleeding.  Past medical history,surgical history, problem list, medications, allergies, family history and social history were all reviewed and documented in the EPIC chart.  Directed ROS with pertinent positives and negatives documented in the history of present illness/assessment and plan.  Exam: Kim assistant General appearance  Normal Abdomen soft nontender without masses guarding rebound organomegaly. Pelvic external BUS vagina normal. Cervix normal. Uterus normal sized mobile nontender. Adnexa without masses or tenderness.  Assessment/Plan:  48 y.o. [name] with normal postoperative checkup status post hysteroscopy D&C. Pathology was benign showing secretory endometrium. Patient will keep menstrual calendar as long as no further irregular bleeding and we'll follow expectantly. If any irregular bleeding she represent for evaluation. Patient is due for her annual exam I have asked her to schedule this in 2 months and that will allow [place] to see what she does over the next several months.  Note: This document was prepared with digital dictation and possible smart phrase technology. Any transcriptional errors that result from this process are unintentional.   Anastasio Auerbach MD, 8:28 AM 05/02/2013

## 2013-05-02 NOTE — Patient Instructions (Signed)
Followup in 2 months for your annual exam, sooner if irregular bleeding continues.

## 2013-06-11 ENCOUNTER — Ambulatory Visit (INDEPENDENT_AMBULATORY_CARE_PROVIDER_SITE_OTHER): Payer: BC Managed Care – PPO | Admitting: Gynecology

## 2013-06-11 ENCOUNTER — Encounter: Payer: Self-pay | Admitting: Gynecology

## 2013-06-11 VITALS — BP 150/92 | Ht 66.0 in | Wt 227.0 lb

## 2013-06-11 DIAGNOSIS — Z01419 Encounter for gynecological examination (general) (routine) without abnormal findings: Secondary | ICD-10-CM

## 2013-06-11 LAB — COMPREHENSIVE METABOLIC PANEL
ALT: 15 U/L (ref 0–35)
AST: 23 U/L (ref 0–37)
Albumin: 3.8 g/dL (ref 3.5–5.2)
Alkaline Phosphatase: 76 U/L (ref 39–117)
BUN: 8 mg/dL (ref 6–23)
CALCIUM: 9 mg/dL (ref 8.4–10.5)
CO2: 26 meq/L (ref 19–32)
CREATININE: 0.68 mg/dL (ref 0.50–1.10)
Chloride: 105 mEq/L (ref 96–112)
GLUCOSE: 87 mg/dL (ref 70–99)
Potassium: 3.4 mEq/L — ABNORMAL LOW (ref 3.5–5.3)
Sodium: 138 mEq/L (ref 135–145)
Total Bilirubin: 0.4 mg/dL (ref 0.2–1.2)
Total Protein: 7 g/dL (ref 6.0–8.3)

## 2013-06-11 LAB — CBC WITH DIFFERENTIAL/PLATELET
Basophils Absolute: 0 10*3/uL (ref 0.0–0.1)
Basophils Relative: 0 % (ref 0–1)
EOS PCT: 5 % (ref 0–5)
Eosinophils Absolute: 0.4 10*3/uL (ref 0.0–0.7)
HEMATOCRIT: 37.1 % (ref 36.0–46.0)
HEMOGLOBIN: 12.6 g/dL (ref 12.0–15.0)
LYMPHS ABS: 3 10*3/uL (ref 0.7–4.0)
Lymphocytes Relative: 41 % (ref 12–46)
MCH: 29.8 pg (ref 26.0–34.0)
MCHC: 34 g/dL (ref 30.0–36.0)
MCV: 87.7 fL (ref 78.0–100.0)
MONOS PCT: 6 % (ref 3–12)
Monocytes Absolute: 0.4 10*3/uL (ref 0.1–1.0)
Neutro Abs: 3.5 10*3/uL (ref 1.7–7.7)
Neutrophils Relative %: 48 % (ref 43–77)
Platelets: 355 10*3/uL (ref 150–400)
RBC: 4.23 MIL/uL (ref 3.87–5.11)
RDW: 15.8 % — ABNORMAL HIGH (ref 11.5–15.5)
WBC: 7.3 10*3/uL (ref 4.0–10.5)

## 2013-06-11 LAB — LIPID PANEL
CHOL/HDL RATIO: 3.5 ratio
CHOLESTEROL: 178 mg/dL (ref 0–200)
HDL: 51 mg/dL (ref >39)
LDL Cholesterol: 110 mg/dL — ABNORMAL HIGH (ref 0–99)
Triglycerides: 87 mg/dL (ref <150)
VLDL: 17 mg/dL (ref 0–40)

## 2013-06-11 NOTE — Patient Instructions (Signed)
Recheck your blood pressure.  If remains elevated you will need to see a primary physician   Call to Schedule your mammogram  Facilities in [place]: 1)  [facility], [place] Phone: [phone] 2)  [facility]. [address] [employer] [place] Phone: [phone] 3)  Dr. Isaiah Blakes at [facility] [address] Phone: [phone]     Mammogram A mammogram is an X-ray test to find changes in a woman's breast. You should get a mammogram if:  You are 65 years of age or older  You have risk factors.   Your doctor recommends that you have one.  BEFORE THE TEST  Do not schedule the test the week before your period, especially if your breasts are sore during this time.  On the day of your mammogram:  Wash your breasts and armpits well. After washing, do not put on any deodorant or talcum powder on until after your test.   Eat and drink as you usually do.   Take your medicines as usual.   If you are diabetic and take insulin, make sure you:   Eat before coming for your test.   Take your insulin as usual.   If you cannot keep your appointment, call before the appointment to cancel. Schedule another appointment.  TEST  You will need to undress from the waist up. You will put on a hospital gown.   Your breast will be put on the mammogram machine, and it will press firmly on your breast with a piece of plastic called a compression paddle. This will make your breast flatter so that the machine can X-ray all parts of your breast.   Both breasts will be X-rayed. Each breast will be X-rayed from above and from the side. An X-ray might need to be taken again if the picture is not good enough.   The mammogram will last about 15 to 30 minutes.  AFTER THE TEST Finding out the results of your test Ask when your test results will be ready. Make sure you get your test results.  Document  Released: 03/26/2008 Document Revised: 12/17/2010 Document Reviewed: 03/26/2008 [facility] Patient Information 2012 [place].  You may obtain a copy of any labs that were done today by logging onto MyChart as outlined in the instructions provided with your AVS (after visit summary). The office will not call with normal lab results but certainly if there are any significant abnormalities then we will contact you.   Health Maintenance, Female A healthy lifestyle and preventative care can promote health and wellness.  Maintain regular health, dental, and eye exams.  Eat a healthy diet. Foods like vegetables, fruits, whole grains, low-fat dairy products, and lean protein foods contain the nutrients you need without too many calories. Decrease your intake of foods high in solid fats, added sugars, and salt. Get information about a proper diet from your caregiver, if necessary.  Regular physical exercise is one of the most important things you can do for your health. Most adults should get at least 150 minutes of moderate-intensity exercise (any activity that increases your heart rate and causes you to sweat) each week. In addition, most adults need muscle-strengthening exercises on 2 or more days a week.   Maintain a healthy weight. The body mass index (BMI) is a screening tool to identify possible weight problems. It provides an estimate of body fat based on height and weight. Your  caregiver can help determine your BMI, and can help you achieve or maintain a healthy weight. For adults 20 years and older:  A BMI below 18.5 is considered underweight.  A BMI of 18.5 to 24.9 is normal.  A BMI of 25 to 29.9 is considered overweight.  A BMI of 30 and above is considered obese.  Maintain normal blood lipids and cholesterol by exercising and minimizing your intake of saturated fat. Eat a balanced diet with plenty of fruits and vegetables. Blood tests for lipids and cholesterol should begin at age  6 and be repeated every 5 years. If your lipid or cholesterol levels are high, you are over 50, or you are a high risk for heart disease, you may need your cholesterol levels checked more frequently.Ongoing high lipid and cholesterol levels should be treated with medicines if diet and exercise are not effective.  If you smoke, find out from your caregiver how to quit. If you do not use tobacco, do not start.  Lung cancer screening is recommended for adults aged 41 80 years who are at high risk for developing lung cancer because of a history of smoking. Yearly low-dose computed tomography (CT) is recommended for people who have at least a 30-pack-year history of smoking and are a current smoker or have quit within the past 15 years. A pack year of smoking is smoking an average of 1 pack of cigarettes a day for 1 year (for example: 1 pack a day for 30 years or 2 packs a day for 15 years). Yearly screening should continue until the smoker has stopped smoking for at least 15 years. Yearly screening should also be stopped for people who develop a health problem that would prevent them from having lung cancer treatment.  If you are pregnant, do not drink alcohol. If you are breastfeeding, be very cautious about drinking alcohol. If you are not pregnant and choose to drink alcohol, do not exceed 1 drink per day. One drink is considered to be 12 ounces (355 mL) of beer, 5 ounces (148 mL) of wine, or 1.5 ounces (44 mL) of liquor.  Avoid use of street drugs. Do not share needles with anyone. Ask for help if you need support or instructions about stopping the use of drugs.  High blood pressure causes heart disease and increases the risk of stroke. Blood pressure should be checked at least every 1 to 2 years. Ongoing high blood pressure should be treated with medicines, if weight loss and exercise are not effective.  If you are 42 to 48 years old, ask your caregiver if you should take aspirin to prevent  strokes.  Diabetes screening involves taking a blood sample to check your fasting blood sugar level. This should be done once every 3 years, after age 104, if you are within normal weight and without risk factors for diabetes. Testing should be considered at a younger age or be carried out more frequently if you are overweight and have at least 1 risk factor for diabetes.  Breast cancer screening is essential preventative care for women. You should practice "breast self-awareness." This means understanding the normal appearance and feel of your breasts and may include breast self-examination. Any changes detected, no matter how small, should be reported to a caregiver. Women in their 84s and 30s should have a clinical breast exam (CBE) by a caregiver as part of a regular health exam every 1 to 3 years. After age 62, women should have a CBE every year.  Starting at age 33, women should consider having a mammogram (breast X-ray) every year. Women who have a family history of breast cancer should talk to their caregiver about genetic screening. Women at a high risk of breast cancer should talk to their caregiver about having an MRI and a mammogram every year.  Breast cancer gene (BRCA)-related cancer risk assessment is recommended for women who have family members with BRCA-related cancers. BRCA-related cancers include breast, ovarian, tubal, and peritoneal cancers. Having family members with these cancers may be associated with an increased risk for harmful changes (mutations) in the breast cancer genes BRCA1 and BRCA2. Results of the assessment will determine the need for genetic counseling and BRCA1 and BRCA2 testing.  The Pap test is a screening test for cervical cancer. Women should have a Pap test starting at age 32. Between ages 53 and 8, Pap tests should be repeated every 2 years. Beginning at age 34, you should have a Pap test every 3 years as long as the past 3 Pap tests have been normal. If you had a  hysterectomy for a problem that was not cancer or a condition that could lead to cancer, then you no longer need Pap tests. If you are between ages 1 and 20, and you have had normal Pap tests going back 10 years, you no longer need Pap tests. If you have had past treatment for cervical cancer or a condition that could lead to cancer, you need Pap tests and screening for cancer for at least 20 years after your treatment. If Pap tests have been discontinued, risk factors (such as a new sexual partner) need to be reassessed to determine if screening should be resumed. Some women have medical problems that increase the chance of getting cervical cancer. In these cases, your caregiver may recommend more frequent screening and Pap tests.  The human papillomavirus (HPV) test is an additional test that may be used for cervical cancer screening. The HPV test looks for the virus that can cause the cell changes on the cervix. The cells collected during the Pap test can be tested for HPV. The HPV test could be used to screen women aged 81 years and older, and should be used in women of any age who have unclear Pap test results. After the age of 15, women should have HPV testing at the same frequency as a Pap test.  Colorectal cancer can be detected and often prevented. Most routine colorectal cancer screening begins at the age of 57 and continues through age 80. However, your caregiver may recommend screening at an earlier age if you have risk factors for colon cancer. On a yearly basis, your caregiver may provide home test kits to check for hidden blood in the stool. Use of a small camera at the end of a tube, to directly examine the colon (sigmoidoscopy or colonoscopy), can detect the earliest forms of colorectal cancer. Talk to your caregiver about this at age 55, when routine screening begins. Direct examination of the colon should be repeated every 5 to 10 years through age 73, unless early forms of pre-cancerous  polyps or small growths are found.  Hepatitis C blood testing is recommended for all people born from [birth year] through 1965 and any individual with known risks for hepatitis C.  Practice safe sex. Use condoms and avoid high-risk sexual practices to reduce the spread of sexually transmitted infections (STIs). Sexually active women aged 60 and younger should be checked for Chlamydia, which is a common  sexually transmitted infection. Older women with new or multiple partners should also be tested for Chlamydia. Testing for other STIs is recommended if you are sexually active and at increased risk.  Osteoporosis is a disease in which the bones lose minerals and strength with aging. This can result in serious bone fractures. The risk of osteoporosis can be identified using a bone density scan. Women ages 52 and over and women at risk for fractures or osteoporosis should discuss screening with their caregivers. Ask your caregiver whether you should be taking a calcium supplement or vitamin D to reduce the rate of osteoporosis.  Menopause can be associated with physical symptoms and risks. Hormone replacement therapy is available to decrease symptoms and risks. You should talk to your caregiver about whether hormone replacement therapy is right for you.  Use sunscreen. Apply sunscreen liberally and repeatedly throughout the day. You should seek shade when your shadow is shorter than you. Protect yourself by wearing long sleeves, pants, a wide-brimmed hat, and sunglasses year round, whenever you are outdoors.  Notify your caregiver of new moles or changes in moles, especially if there is a change in shape or color. Also notify your caregiver if a mole is larger than the size of a pencil eraser.  Stay current with your immunizations. Document Released: 07/13/2010 Document Revised: 04/24/2012 Document Reviewed: 07/13/2010 [facility] Patient Information 2014 [employer].

## 2013-06-11 NOTE — Progress Notes (Signed)
Judy [name] [birth year] [id number]        48 y.o.  [id number] for annual exam.  Doing well.  Past medical history,surgical history, problem list, medications, allergies, family history and social history were all reviewed and documented as reviewed in the EPIC chart.  ROS:  12 system ROS performed with pertinent positives and negatives included in the history, assessment and plan.  Included Systems: General, HEENT, Neck, Cardiovascular, Pulmonary, Gastrointestinal, Genitourinary, Musculoskeletal, Dermatologic, Endocrine, Hematological, Neurologic, Psychiatric Additional significant findings :  None   Exam: Kim assistant Filed Vitals:   06/11/13 1508  BP: 150/92  Height: 5\' 6"  (1.676 m)  Weight: 227 lb (102.967 kg)   General appearance:  Normal affect, orientation and appearance. Skin: Grossly normal HEENT: Without gross lesions.  No cervical or supraclavicular adenopathy. Thyroid normal.  Lungs:  Clear without wheezing, rales or rhonchi Cardiac: RR, without RMG Abdominal:  Soft, nontender, without masses, guarding, rebound, organomegaly or hernia Breasts:  Examined lying and sitting without masses, retractions, discharge or axillary adenopathy. Pelvic:  Ext/BUS/vagina normal  Cervix normal  Uterus anteverted, normal size, shape and contour, midline and mobile nontender   Adnexa  Without masses or tenderness    Anus and perineum  Normal   Rectovaginal  Normal sphincter tone without palpated masses or tenderness.    Assessment/Plan:  48 y.o. [id number] female for annual exam with regular menses, tubal sterilization.   1. Recent hysteroscopy D&C for irregular bleeding 04/2013. Pathology showed secretory endometrium.  Last menses regular. Will keep menstrual calendar report any irregularity. Does note her periods tend to be heavier I discussed options to include Mirena IUD. Patient will followup as needed. 2. Mammography never. I strongly recommended screening mammography. Benefits of early  detection reviewed. Rest cancer most common cancer in women stressed. Patient agrees to schedule. Names and numbers provided. SBE monthly reviewed. 3. Pap smear/HPV negative 2014. No Pap smear done today. No history of significant abnormal Pap smears. Plan repeat Pap smear 3-5 year interval. 4. Blood pressure 150/92. Asked patient to have repeated in a non-exam situation. Normal range is reviewed and need to followup with primary if continues elevated. Long-term risks of stroke heart attack reviewed. 5. Health maintenance. Baseline CBC comprehensive metabolic panel lipid profile urinalysis TSH ordered. Followup in one year, sooner if irregular bleeding or heavy bleeding continues.   Note: This document was prepared with digital dictation and possible smart phrase technology. Any transcriptional errors that result from this process are unintentional.   Anastasio Auerbach MD, 3:43 PM 06/11/2013

## 2013-06-12 ENCOUNTER — Other Ambulatory Visit: Payer: Self-pay | Admitting: Gynecology

## 2013-06-12 DIAGNOSIS — E876 Hypokalemia: Secondary | ICD-10-CM

## 2013-06-12 LAB — TSH: TSH: 0.734 u[IU]/mL (ref 0.350–4.500)

## 2013-11-12 ENCOUNTER — Encounter: Payer: Self-pay | Admitting: Gynecology

## 2014-08-06 ENCOUNTER — Other Ambulatory Visit: Payer: Self-pay | Admitting: Internal Medicine

## 2014-08-06 DIAGNOSIS — Z1231 Encounter for screening mammogram for malignant neoplasm of breast: Secondary | ICD-10-CM

## 2015-02-13 ENCOUNTER — Encounter: Payer: Self-pay | Admitting: Gynecology

## 2015-03-24 ENCOUNTER — Encounter: Payer: Self-pay | Admitting: Gynecology

## 2015-04-10 ENCOUNTER — Encounter: Payer: Self-pay | Admitting: Gynecology

## 2015-05-01 ENCOUNTER — Encounter: Payer: Self-pay | Admitting: Gynecology

## 2015-05-01 ENCOUNTER — Ambulatory Visit (INDEPENDENT_AMBULATORY_CARE_PROVIDER_SITE_OTHER): Payer: BLUE CROSS/BLUE SHIELD | Admitting: Gynecology

## 2015-05-01 VITALS — BP 122/74 | Ht 67.0 in | Wt 217.0 lb

## 2015-05-01 DIAGNOSIS — Z13220 Encounter for screening for lipoid disorders: Secondary | ICD-10-CM

## 2015-05-01 DIAGNOSIS — N912 Amenorrhea, unspecified: Secondary | ICD-10-CM

## 2015-05-01 DIAGNOSIS — Z01419 Encounter for gynecological examination (general) (routine) without abnormal findings: Secondary | ICD-10-CM | POA: Diagnosis not present

## 2015-05-01 LAB — LIPID PANEL
CHOLESTEROL: 169 mg/dL (ref 125–200)
HDL: 49 mg/dL (ref >=46)
LDL Cholesterol: 100 mg/dL (ref <130)
Total CHOL/HDL Ratio: 3.4 Ratio (ref <=5.0)
Triglycerides: 99 mg/dL (ref <150)
VLDL: 20 mg/dL (ref <30)

## 2015-05-01 LAB — COMPREHENSIVE METABOLIC PANEL
ALBUMIN: 3.9 g/dL (ref 3.6–5.1)
ALK PHOS: 85 U/L (ref 33–115)
ALT: 14 U/L (ref 6–29)
AST: 22 U/L (ref 10–35)
BILIRUBIN TOTAL: 0.5 mg/dL (ref 0.2–1.2)
BUN: 13 mg/dL (ref 7–25)
CALCIUM: 9.4 mg/dL (ref 8.6–10.2)
CO2: 28 mmol/L (ref 20–31)
Chloride: 101 mmol/L (ref 98–110)
Creat: 0.87 mg/dL (ref 0.50–1.10)
GLUCOSE: 64 mg/dL — AB (ref 65–99)
Potassium: 3.8 mmol/L (ref 3.5–5.3)
Sodium: 138 mmol/L (ref 135–146)
TOTAL PROTEIN: 7.4 g/dL (ref 6.1–8.1)

## 2015-05-01 LAB — CBC WITH DIFFERENTIAL/PLATELET
BASOS PCT: 0 %
Basophils Absolute: 0 cells/uL (ref 0–200)
Eosinophils Absolute: 237 cells/uL (ref 15–500)
Eosinophils Relative: 3 %
HCT: 36.4 % (ref 35.0–45.0)
HEMOGLOBIN: 11.7 g/dL (ref 11.7–15.5)
LYMPHS ABS: 3160 {cells}/uL (ref 850–3900)
Lymphocytes Relative: 40 %
MCH: 28.7 pg (ref 27.0–33.0)
MCHC: 32.1 g/dL (ref 32.0–36.0)
MCV: 89.2 fL (ref 80.0–100.0)
MPV: 9.5 fL (ref 7.5–12.5)
Monocytes Absolute: 474 cells/uL (ref 200–950)
Monocytes Relative: 6 %
NEUTROS ABS: 4029 {cells}/uL (ref 1500–7800)
Neutrophils Relative %: 51 %
Platelets: 405 10*3/uL — ABNORMAL HIGH (ref 140–400)
RBC: 4.08 MIL/uL (ref 3.80–5.10)
RDW: 17.1 % — AB (ref 11.0–15.0)
WBC: 7.9 10*3/uL (ref 3.8–10.8)

## 2015-05-01 LAB — PREGNANCY, URINE: Preg Test, Ur: NEGATIVE

## 2015-05-01 NOTE — Progress Notes (Signed)
    Judy [name] [birth year] [id number]        50 y.o.  [id number]  for annual exam.  Has not had a period in 2 months. Regular monthly menses before. No hot flashes night sweats or other changes. Status post tubal sterilization.  Past medical history,surgical history, problem list, medications, allergies, family history and social history were all reviewed and documented as reviewed in the EPIC chart.  ROS:  Performed with pertinent positives and negatives included in the history, assessment and plan.   Additional significant findings :  none   Exam: [name] assistant Filed Vitals:   05/01/15 1141  BP: 122/74  Height: 5\' 7"  (1.702 m)  Weight: 217 lb (98.431 kg)   General appearance:  Normal affect, orientation and appearance. Skin: Grossly normal HEENT: Without gross lesions.  No cervical or supraclavicular adenopathy. Thyroid normal.  Lungs:  Clear without wheezing, rales or rhonchi Cardiac: RR, without RMG Abdominal:  Soft, nontender, without masses, guarding, rebound, organomegaly or hernia Breasts:  Examined lying and sitting without masses, retractions, discharge or axillary adenopathy. Pelvic:  Ext/BUS/vagina normal  Cervix normal  Uterus anteverted, normal size, shape and contour, midline and mobile nontender   Adnexa without masses or tenderness    Anus and perineum normal   Rectovaginal normal sphincter tone without palpated masses or tenderness.    Assessment/Plan:  50 y.o. [name] female for annual exam, history BTL.   1. Skipped menses 2 months. Will check baseline UPT/TSH/FSH. Discussed differential to include anovulatory versus early menopause. If [place] normal discussed need for progesterone withdrawal. Will discuss after labs return. 2. Mammography this past summer. Recommend follow up mammogram this coming summer. SBE notes are reviewed. 3. Pap smear/HPV/2014. No Pap smear done today. No history of abnormal Pap smears previously. Plan repeat Pap smear at 5 year  interval per current screening guidelines. 4. Health maintenance. Patient requests baseline labs. CBC, comprehensive metabolic panel, lipid profile, urinalysis, TSH, FSH, UPT ordered. Follow up for lab results otherwise follow up in one year, sooner as needed.   Anastasio Auerbach MD, 12:05 PM 05/01/2015

## 2015-05-01 NOTE — Patient Instructions (Signed)

## 2015-05-02 LAB — URINALYSIS W MICROSCOPIC + REFLEX CULTURE
Bilirubin Urine: NEGATIVE
CASTS: NONE SEEN [LPF]
Glucose, UA: NEGATIVE
Hgb urine dipstick: NEGATIVE
Leukocytes, UA: NEGATIVE
Nitrite: NEGATIVE
PROTEIN: NEGATIVE
Specific Gravity, Urine: 1.032 (ref 1.001–1.035)
YEAST: NONE SEEN [HPF]
pH: 6 (ref 5.0–8.0)

## 2015-05-02 LAB — FOLLICLE STIMULATING HORMONE: FSH: 56.9 m[IU]/mL

## 2015-05-03 LAB — URINE CULTURE
Colony Count: NO GROWTH
ORGANISM ID, BACTERIA: NO GROWTH

## 2017-02-03 ENCOUNTER — Encounter (HOSPITAL_COMMUNITY): Payer: Self-pay | Admitting: *Deleted

## 2017-02-03 ENCOUNTER — Other Ambulatory Visit: Payer: Self-pay

## 2017-02-03 ENCOUNTER — Observation Stay (HOSPITAL_COMMUNITY)
Admission: EM | Admit: 2017-02-03 | Discharge: 2017-02-05 | Disposition: A | Discharge status: Home / Self Care | Payer: BLUE CROSS/BLUE SHIELD | Attending: Family Medicine | Admitting: Family Medicine

## 2017-02-03 ENCOUNTER — Emergency Department (HOSPITAL_COMMUNITY): Payer: BLUE CROSS/BLUE SHIELD

## 2017-02-03 DIAGNOSIS — R200 Anesthesia of skin: Secondary | ICD-10-CM | POA: Diagnosis present

## 2017-02-03 DIAGNOSIS — N179 Acute kidney failure, unspecified: Secondary | ICD-10-CM | POA: Diagnosis not present

## 2017-02-03 DIAGNOSIS — I421 Obstructive hypertrophic cardiomyopathy: Secondary | ICD-10-CM

## 2017-02-03 DIAGNOSIS — J329 Chronic sinusitis, unspecified: Secondary | ICD-10-CM | POA: Diagnosis present

## 2017-02-03 DIAGNOSIS — R0789 Other chest pain: Secondary | ICD-10-CM | POA: Diagnosis present

## 2017-02-03 DIAGNOSIS — Z79899 Other long term (current) drug therapy: Secondary | ICD-10-CM | POA: Diagnosis not present

## 2017-02-03 DIAGNOSIS — R079 Chest pain, unspecified: Secondary | ICD-10-CM

## 2017-02-03 DIAGNOSIS — R9431 Abnormal electrocardiogram [ECG] [EKG]: Secondary | ICD-10-CM | POA: Diagnosis not present

## 2017-02-03 DIAGNOSIS — J019 Acute sinusitis, unspecified: Secondary | ICD-10-CM | POA: Insufficient documentation

## 2017-02-03 DIAGNOSIS — E876 Hypokalemia: Secondary | ICD-10-CM | POA: Diagnosis not present

## 2017-02-03 HISTORY — DX: Obstructive hypertrophic cardiomyopathy: I42.1

## 2017-02-03 LAB — I-STAT TROPONIN, ED
TROPONIN I, POC: 0.03 ng/mL (ref 0.00–0.08)
Troponin i, poc: 0.03 ng/mL (ref 0.00–0.08)

## 2017-02-03 LAB — BASIC METABOLIC PANEL
ANION GAP: 12 (ref 5–15)
BUN: 19 mg/dL (ref 6–20)
CO2: 24 mmol/L (ref 22–32)
Calcium: 8.9 mg/dL (ref 8.9–10.3)
Chloride: 94 mmol/L — ABNORMAL LOW (ref 101–111)
Creatinine, Ser: 1.19 mg/dL — ABNORMAL HIGH (ref 0.44–1.00)
GFR, EST NON AFRICAN AMERICAN: 52 mL/min — AB (ref >60)
Glucose, Bld: 112 mg/dL — ABNORMAL HIGH (ref 65–99)
POTASSIUM: 3.4 mmol/L — AB (ref 3.5–5.1)
SODIUM: 130 mmol/L — AB (ref 135–145)

## 2017-02-03 LAB — CBC
HEMATOCRIT: 40.4 % (ref 36.0–46.0)
HEMOGLOBIN: 14 g/dL (ref 12.0–15.0)
MCH: 31.1 pg (ref 26.0–34.0)
MCHC: 34.7 g/dL (ref 30.0–36.0)
MCV: 89.8 fL (ref 78.0–100.0)
Platelets: 387 10*3/uL (ref 150–400)
RBC: 4.5 MIL/uL (ref 3.87–5.11)
RDW: 14.8 % (ref 11.5–15.5)
WBC: 10.6 10*3/uL — AB (ref 4.0–10.5)

## 2017-02-03 NOTE — ED Triage Notes (Signed)
Pt reports onset yesterday of mid sharp/pressure chest pains. Difficulty working today due to pain, had n/v and lower abd cramping. Went [facility] and sent here due to abnormal ekg.

## 2017-02-03 NOTE — ED Notes (Addendum)
Patient reports that her left arm was "locking up" and her left hand was cramping. Brought to Triage A1 for re-eval. Strong radial pulse. +CMS.  Respiration unlabored. Encouraged slow, deep breathing.

## 2017-02-03 NOTE — ED Notes (Signed)
Lab work, radiology results and vital signs reviewed, no critical results at this time, no change in acuity indicated.  

## 2017-02-04 ENCOUNTER — Encounter (HOSPITAL_COMMUNITY): Payer: Self-pay | Admitting: Internal Medicine

## 2017-02-04 ENCOUNTER — Observation Stay (HOSPITAL_BASED_OUTPATIENT_CLINIC_OR_DEPARTMENT_OTHER): Payer: BLUE CROSS/BLUE SHIELD

## 2017-02-04 DIAGNOSIS — R079 Chest pain, unspecified: Secondary | ICD-10-CM

## 2017-02-04 DIAGNOSIS — R202 Paresthesia of skin: Secondary | ICD-10-CM

## 2017-02-04 DIAGNOSIS — J329 Chronic sinusitis, unspecified: Secondary | ICD-10-CM | POA: Diagnosis present

## 2017-02-04 DIAGNOSIS — I421 Obstructive hypertrophic cardiomyopathy: Secondary | ICD-10-CM | POA: Diagnosis not present

## 2017-02-04 DIAGNOSIS — N179 Acute kidney failure, unspecified: Secondary | ICD-10-CM | POA: Diagnosis not present

## 2017-02-04 DIAGNOSIS — E876 Hypokalemia: Secondary | ICD-10-CM

## 2017-02-04 DIAGNOSIS — R200 Anesthesia of skin: Secondary | ICD-10-CM | POA: Diagnosis present

## 2017-02-04 HISTORY — DX: Obstructive hypertrophic cardiomyopathy: I42.1

## 2017-02-04 LAB — VITAMIN B12: VITAMIN B 12: 1097 pg/mL — AB (ref 180–914)

## 2017-02-04 LAB — RAPID URINE DRUG SCREEN, HOSP PERFORMED
Amphetamines: NOT DETECTED
Barbiturates: NOT DETECTED
Benzodiazepines: NOT DETECTED
Cocaine: NOT DETECTED
Opiates: NOT DETECTED
Tetrahydrocannabinol: NOT DETECTED

## 2017-02-04 LAB — HEMOGLOBIN A1C
HEMOGLOBIN A1C: 5.9 % — AB (ref 4.8–5.6)
Mean Plasma Glucose: 122.63 mg/dL

## 2017-02-04 LAB — TROPONIN I
TROPONIN I: 0.03 ng/mL — AB (ref <0.03)
Troponin I: <0.03 ng/mL (ref <0.03)
Troponin I: <0.03 ng/mL (ref <0.03)

## 2017-02-04 LAB — D-DIMER, QUANTITATIVE (NOT AT ARMC): D DIMER QUANT: 0.44 ug{FEU}/mL (ref 0.00–0.50)

## 2017-02-04 LAB — TSH: TSH: 1.204 u[IU]/mL (ref 0.350–4.500)

## 2017-02-04 LAB — ECHOCARDIOGRAM STRESS TEST
CSEPED: 10 min
CSEPEDS: 14 s
CSEPPHR: 136 {beats}/min
Estimated workload: 7.6 METS
Rest HR: 94 {beats}/min

## 2017-02-04 LAB — ECHOCARDIOGRAM COMPLETE
HEIGHTINCHES: 67 in
WEIGHTICAEL: 3600 [oz_av]

## 2017-02-04 LAB — LIPID PANEL
CHOLESTEROL: 159 mg/dL (ref 0–200)
HDL: 34 mg/dL — AB (ref >40)
LDL Cholesterol: 99 mg/dL (ref 0–99)
TRIGLYCERIDES: 129 mg/dL (ref <150)
Total CHOL/HDL Ratio: 4.7 RATIO
VLDL: 26 mg/dL (ref 0–40)

## 2017-02-04 LAB — I-STAT TROPONIN, ED: TROPONIN I, POC: 0.03 ng/mL (ref 0.00–0.08)

## 2017-02-04 LAB — HCG, QUANTITATIVE, PREGNANCY: HCG, BETA CHAIN, QUANT, S: 3 m[IU]/mL (ref <5)

## 2017-02-04 LAB — FOLATE: Folate: 9.2 ng/mL (ref >5.9)

## 2017-02-04 LAB — GLUCOSE, CAPILLARY: Glucose-Capillary: 122 mg/dL — ABNORMAL HIGH (ref 65–99)

## 2017-02-04 LAB — HIV ANTIBODY (ROUTINE TESTING W REFLEX): HIV SCREEN 4TH GENERATION: NONREACTIVE

## 2017-02-04 MED ORDER — ONDANSETRON HCL 4 MG/2ML IJ SOLN: 4.0000 mg | Freq: Four times a day (QID) | INTRAMUSCULAR | Status: DC | PRN

## 2017-02-04 MED ORDER — KETOROLAC TROMETHAMINE 30 MG/ML IJ SOLN
30.0000 mg | Freq: Once | INTRAMUSCULAR | Status: AC
  Administered 2017-02-04: 30 mg via INTRAVENOUS
  Filled 2017-02-04: qty 1

## 2017-02-04 MED ORDER — NITROGLYCERIN 0.4 MG SL SUBL: 0.4000 mg | SUBLINGUAL_TABLET | SUBLINGUAL | Status: DC | PRN

## 2017-02-04 MED ORDER — ENOXAPARIN SODIUM 40 MG/0.4ML ~~LOC~~ SOLN
40.0000 mg | SUBCUTANEOUS | Status: DC
  Administered 2017-02-04: 40 mg via SUBCUTANEOUS
  Filled 2017-02-04 (×2): qty 0.4

## 2017-02-04 MED ORDER — ASPIRIN EC 325 MG PO TBEC
325.0000 mg | DELAYED_RELEASE_TABLET | Freq: Every day | ORAL | Status: DC
  Administered 2017-02-04 – 2017-02-05 (×2): 325 mg via ORAL
  Filled 2017-02-04 (×2): qty 1

## 2017-02-04 MED ORDER — SODIUM CHLORIDE 0.9 % IV SOLN
INTRAVENOUS | Status: DC
  Administered 2017-02-04 (×2): via INTRAVENOUS

## 2017-02-04 MED ORDER — POTASSIUM CHLORIDE 20 MEQ/15ML (10%) PO SOLN
20.0000 meq | Freq: Once | ORAL | Status: AC
  Administered 2017-02-04: 20 meq via ORAL
  Filled 2017-02-04: qty 15

## 2017-02-04 MED ORDER — ZOLPIDEM TARTRATE 5 MG PO TABS: 5.0000 mg | ORAL_TABLET | Freq: Every evening | ORAL | Status: DC | PRN

## 2017-02-04 MED ORDER — ALPRAZOLAM 0.25 MG PO TABS: 0.2500 mg | ORAL_TABLET | Freq: Two times a day (BID) | ORAL | Status: DC | PRN

## 2017-02-04 MED ORDER — MORPHINE SULFATE (PF) 4 MG/ML IV SOLN: 2.0000 mg | INTRAVENOUS | Status: DC | PRN

## 2017-02-04 MED ORDER — ACETAMINOPHEN 325 MG PO TABS: 650.0000 mg | ORAL_TABLET | ORAL | Status: DC | PRN

## 2017-02-04 NOTE — H&P (Signed)
History and Physical    Judy [name] GEX:[id number] DOB: Mar 03, 1965 DOA: 02/03/2017  Referring MD/NP/PA:   PCP: Antonietta Jewel, MD   Patient coming from:  The patient is coming from home.  At baseline, pt is independent for most of ADL.   Chief Complaint: Chest pain, right arm numbness  HPI: Judy [name] is a 52 y.o. female without significant medical history except for skin tightening in hands, who presents with chest pain, right arm numbness.  Patient states that she has been having chest pain in the past 3 days. It is located in the substernal area, initially 9 out of 10 in severity, currently 2 out of 10 in severity, sharp and also pressure-like, nonradiating. It is associated with mild shortness of breath and dizziness. The chest pain is not pleuritic, not aggravated by deep breath. Patient has mild dry cough, no fever or chills. No recent long distant traveling. No tenderness in the calf areas. Patient also reports right arm and hand numbness during the same period of time. She does not have weakness in extremities. No vision change, hearing loss, slurred speech or facial droop. Patient states that she had nausea and vomiting and right lower abdominal cramping yesterday, which have resolved completely. Currently no nausea, vomiting, diarrhea or abdominal pain. Patient denies symptoms of UTI. Patient states that she has been taking Bactrim in the past 8 days for sinusitis. Her symptoms have improved significantly. Patient states that she has skin tighteness in both hands, for which she is taking HCTZ for fluid retention per PCP. She does not have leg edema. Patient states that her sister had CABG at age of 73.  ED Course: pt was found to have troponin negative 3, negative d-dimer, WBC 10.6, potassium is 3.4, mild acute renal injury with creatinine 1.19, temperature normal, no tachycardia, oxygen saturation 97% on room air, negative chest x-ray. Patient is placed on telemetry bed for  observation.  Review of Systems:   General: no fevers, chills, no body weight gain, has fatigue HEENT: no blurry vision, hearing changes or sore throat Respiratory: has dyspnea, coughing, no wheezing CV: has chest pain, no palpitations GI: currently no nausea, vomiting, abdominal pain, diarrhea, constipation GU: no dysuria, burning on urination, increased urinary frequency, hematuria  Ext: no leg edema Neuro: has right hand and arm numbness Skin: no rash, no skin tear. MSK: No muscle spasm, no deformity, no limitation of range of movement in spin Heme: No easy bruising.  Travel history: No recent long distant travel.  Allergy:  Allergies  Allergen Reactions  . Naproxen Sodium Hives    ANAPROX    History reviewed. No pertinent past medical history.  Past Surgical History:  Procedure Laterality Date  . BARTHOLIN CYST MARSUPIALIZATION    . CESAREAN SECTION     X3 with BTL  . DILATATION & CURETTAGE/HYSTEROSCOPY WITH TRUECLEAR N/A 04/12/2013   Procedure: DILATATION & CURETTAGE/HYSTEROSCOPY ;  Surgeon: Anastasio Auerbach, MD;  Location: [facility];  Service: Gynecology;  Laterality: N/A;  . TUBAL LIGATION      Social History:  reports that  has never smoked. she has never used smokeless tobacco. She reports that she does not drink alcohol or use drugs.  Family History:  Family History  Problem Relation Age of Onset  . Diabetes Father   . Heart disease Mother   . Heart attack Sister      Prior to Admission medications   Medication Sig Start Date End Date Taking? Authorizing Provider  hydrochlorothiazide (  HYDRODIURIL) 25 MG tablet Take 25 mg by mouth daily. 01/18/17  Yes [provider]  sulfamethoxazole-trimethoprim (BACTRIM DS,SEPTRA DS) 800-160 MG tablet Take 1 tablet by mouth 2 (two) times daily. 01/27/17  Yes [provider]    Physical Exam: Vitals:   02/04/17 0330 02/04/17 0400 02/04/17 0430 02/04/17 0515  BP: 117/77 121/76 132/80 132/85  Pulse: 73 68  64 66  Resp: (!) 22 20 (!) 22 (!) 22  Temp:      TempSrc:      SpO2: 99% 100% 100% 100%  Weight:      Height:       General: Not in acute distress HEENT:       Eyes: PERRL, EOMI, no scleral icterus.       ENT: No discharge from the ears and nose, no pharynx injection, no tonsillar enlargement.        Neck: No JVD, no bruit, no mass felt. Heme: No neck lymph node enlargement. Cardiac: S1/S2, RRR, No murmurs, No gallops or rubs. Respiratory: No rales, wheezing, rhonchi or rubs. GI: Soft, nondistended, nontender, no rebound pain, no organomegaly, BS present. GU: No hematuria Ext: No pitting leg edema bilaterally. 2+DP/PT pulse bilaterally. Musculoskeletal: No joint deformities, No joint redness or warmth, no limitation of ROM in spin. Skin: No rashes.  Neuro: Alert, oriented X3, cranial nerves II-XII grossly intact, moves all extremities normally. Muscle strength 5/5 in all extremities, sensation to light touch intact. Brachial reflex 2+ bilaterally. Negative Babinski's sign. Normal finger to nose test. Psych: Patient is not psychotic, no suicidal or hemocidal ideation.  Labs on Admission: I have personally reviewed following labs and imaging studies  CBC: Recent Labs  Lab 02/03/17 1854  WBC 10.6*  HGB 14.0  HCT 40.4  MCV 89.8  PLT 161   Basic Metabolic Panel: Recent Labs  Lab 02/03/17 1854  NA 130*  K 3.4*  CL 94*  CO2 24  GLUCOSE 112*  BUN 19  CREATININE 1.19*  CALCIUM 8.9   GFR: Estimated Creatinine Clearance: 68.7 mL/min (A) (by C-G formula based on SCr of 1.19 mg/dL (H)). Liver Function Tests: No results for input(s): AST, ALT, ALKPHOS, BILITOT, PROT, ALBUMIN in the last 168 hours. No results for input(s): LIPASE, AMYLASE in the last 168 hours. No results for input(s): AMMONIA in the last 168 hours. Coagulation Profile: No results for input(s): INR, PROTIME in the last 168 hours. Cardiac Enzymes: Recent Labs  Lab 02/04/17 0301  TROPONINI 0.03*   BNP  (last 3 results) No results for input(s): PROBNP in the last 8760 hours. HbA1C: Recent Labs    02/04/17 0301  HGBA1C 5.9*   CBG: No results for input(s): GLUCAP in the last 168 hours. Lipid Profile: Recent Labs    02/04/17 0301  CHOL 159  HDL 34*  LDLCALC 99  TRIG 129  CHOLHDL 4.7   Thyroid Function Tests: Recent Labs    02/04/17 0301  TSH 1.204   Anemia Panel: Recent Labs    02/04/17 0301  VITAMINB12 1,097*  FOLATE 9.2   Urine analysis:    Component Value Date/Time   COLORURINE DARK YELLOW 05/01/2015 [place] 05/01/2015 1214   LABSPEC 1.032 05/01/2015 1214   PHURINE 6.0 05/01/2015 [place] 05/01/2015 1214   HGBUR NEGATIVE 05/01/2015 [place] 05/01/2015 1214   [place] (A) 05/01/2015 [place] 05/01/2015 1214   UROBILINOGEN 1 04/14/2012 1525   NITRITE NEGATIVE 05/01/2015 1214  LEUKOCYTESUR NEGATIVE 05/01/2015 1214   Sepsis Labs: @LABRCNTIP (procalcitonin:4,lacticidven:4) )No results found for this or any previous visit (from the past 240 hour(s)).   Radiological Exams on Admission: Dg Chest 2 View  Result Date: 02/03/2017 CLINICAL DATA:  Chest pain EXAM: CHEST  2 VIEW COMPARISON:  None. FINDINGS: Normal heart size. Normal mediastinal contour. No pneumothorax. No pleural effusion. Lungs appear clear, with no acute consolidative airspace disease and no pulmonary edema. IMPRESSION: No active cardiopulmonary disease. Electronically Signed   By: Ilona Sorrel M.D.   On: 02/03/2017 19:54     EKG: Independently reviewed.  Sinus rhythm, QTC 477, T-wave inversion in 1/aVL, V1-V2, J-point elevation in V3.  Assessment/Plan Principal Problem:   Chest pain Active Problems:   Sinusitis   Right arm numbness   AKI (acute kidney injury) ([place])   Hypokalemia   Chest pain: Etiology is not clear. Patient had atypical chest pain. Initial troponin negative, becomes minimally +0.03 on repeated  troponin. D-dimer negative, less likely to have a PE. Chest x-rays negative, clinically no pneumonia.    -will place on Tele bed for obs - cycle CE q6 x3 and repeat EKG in the am  - prn Nitroglycerin, Morphine, and aspirin - Risk factor stratification: will check FLP, UDS and A1C  - 2d echo - Inpatient non-urgent card consult order was put in Epic and message to [employer] was sent out.  Right arm numbness: Etiology is not clear. Differential diagnoses include C-spine disc issue, stroke, peripheral neuropathy. I discussed with patient for workup study. She would like to do stepwise workup.  -will check Vb12, TSH and folate level. If all negative, pt would likely to consider MRI-c spin and MRI-brain.  AKI: Likely due to prerenal secondary to dehydration and continuation of diuretics and bactrim - IVF: 100 cc/h NS - Follow up renal function by BMP - Hold HCTZ and bactrim  Sinusitis: pt has been taking bactrim in the past 8 day. Symptoms has improved significantly. No fever. WBC is only pain 0.6. -Will stop Bactrim   Hypokalemia: K= 3.4 on admission. - Repleted - Check Mg level  Skin tightness in hands: Etiology is not clear. Patient has been taking HCTZ per primary care doctor. Not sure if patient has workup done for other possibilities, such as scleroderma -hold HCTZ. -Advised the patient to discuss with her PCP if patient has not worked up for other possibilities.   DVT ppx: SQ Lovenox Code Status: Full code Family Communication: Yes, patient's daughter and fianc  at bed side Disposition Plan:  Anticipate discharge back to previous home environment Consults called:  none Admission status: Obs / tele    Date of Service 02/04/2017    Ivor Costa [facility] Pager [phone]  If 7PM-7AM, please contact night-coverage [email or web address] Password [facility] 02/04/2017, 6:43 AM

## 2017-02-04 NOTE — Progress Notes (Signed)
   Pt completed stress echo. Did not achieve 85% of max HR- Stopped at 136 bpm, target was 144 bpm. Pt exercised for 10 minutes. Stopped due to fatigue. No chest pain or significant dyspnea. Only had feeling hot.  Final settings: 2.8 mph, 14% incline. BP rose appropriately with exercise. And recovered normally.   No EKG changes. Will have Dr. Oval Linsey review tracings.   Final results pending Echo reading.   Daune Perch, AGNP-C [facility] 02/04/2017  5:00 PM Pager: [phone]

## 2017-02-04 NOTE — Consult Note (Signed)
Cardiology Consultation:   Patient ID: Judy [name]; [id number]; March 20, 1965   Admit date: 02/03/2017 Date of Consult: 02/04/2017  Primary Care Provider: Antonietta Jewel, MD Primary Cardiologist: No primary care provider on file. New- Dr. Oval Linsey   Patient Profile:   Judy [name] is a 52 y.o. female with no significant medial history who is being seen today for the evaluation of chest pain at the request of Dr. Bonner Puna.  History of Present Illness:   [name] was seen at urgent care yesterday for progressively worsening chest pain over a day, shortness of breath, numbness of the right hand and fast heart beat. EKG thought to show lateral ischemia and pt advised to go to the ED.   [name] had a sinus infection last week and was prescribed a steroid pack and antibiotic which she finished on Tuesday. She felt better except of continued drainage in her throat. Sometime during her illness she began to note right hand numbness and fatigue.   On Wednesday at work she felt very fatigued and had to take rest breaks. SHe also began to have a constant central chest pressure that she felt like was a "cold in my chest". Yesterday she felt OK when she got up and went to work. At work she noted fatigue, the constant chest pressure and was mildly short of breath with exertion. After lunch she developed nausea and vomited once. She then noted cramping in her RLQ. She felt better after vomiting but her work colleagues advised her to go and get checked out. Last night her chest pressure was 7/10 in intensity, it improved through the night and this am was 5/10, now almost resolved. Her fatigue is improved this morning.   She has no previous history of cardiac issues and has never had any cardiac testing or seen a cardiologist. She has never had chest pain before. She denies any history of hypertension, diabetes, stroke, kidney issues, high cholesterol. She has been takong hydrochlorothiazide for "fluid" for about  a year. SHe denies pedal edema, othopnea or PND. She says that her hands get puffy, especially when its hot in the summer and the HCTZ helps this.   Ms. [name] has never smoked or drank alcohol. She does has a family history of cardiac issues with her mother having an "enlarged heart since birth" which was discovered in her 84's and she died of an apparent heart attack at age 106. Her sister had heart valve problems and had a triple bypass at the age of 2 and died in her 3's. She has 5 other sisters and 5 other brothers all reportedly healthy.  History reviewed. No pertinent past medical history.  Past Surgical History:  Procedure Laterality Date  . BARTHOLIN CYST MARSUPIALIZATION    . CESAREAN SECTION     X3 with BTL  . DILATATION & CURETTAGE/HYSTEROSCOPY WITH TRUECLEAR N/A 04/12/2013   Procedure: DILATATION & CURETTAGE/HYSTEROSCOPY ;  Surgeon: Anastasio Auerbach, MD;  Location: [facility];  Service: Gynecology;  Laterality: N/A;  . TUBAL LIGATION       Home Medications:  Prior to Admission medications   Medication Sig Start Date End Date Taking? Authorizing Provider  hydrochlorothiazide (HYDRODIURIL) 25 MG tablet Take 25 mg by mouth daily. 01/18/17  Yes [provider]  sulfamethoxazole-trimethoprim (BACTRIM DS,SEPTRA DS) 800-160 MG tablet Take 1 tablet by mouth 2 (two) times daily. 01/27/17  Yes [provider]    Inpatient Medications: Scheduled Meds: . aspirin EC  325 mg Oral Daily  .  enoxaparin (LOVENOX) injection  40 mg Subcutaneous Q24H   Continuous Infusions: . sodium chloride 75 mL/hr at 02/04/17 0453   PRN Meds: acetaminophen, ALPRAZolam, morphine injection, nitroGLYCERIN, ondansetron (ZOFRAN) IV, zolpidem  Allergies:    Allergies  Allergen Reactions  . Naproxen Sodium Hives    ANAPROX    Social History:   Social History   Socioeconomic History  . Marital status: Single    Spouse name: Not on file  . Number of children: Not on file  . Years of  education: Not on file  . Highest education level: Not on file  Social Needs  . Financial resource strain: Not on file  . Food insecurity - worry: Not on file  . Food insecurity - inability: Not on file  . Transportation needs - medical: Not on file  . Transportation needs - non-medical: Not on file  Occupational History  . Not on file  Tobacco Use  . Smoking status: Never Smoker  . Smokeless tobacco: Never Used  Substance and Sexual Activity  . Alcohol use: No    Alcohol/week: 0.0 oz  . Drug use: No  . Sexual activity: Yes    Birth control/protection: Surgical    Comment: BTL-1st intercourse 52 yo-Fewer than 5 partners  Other Topics Concern  . Not on file  Social History Narrative  . Not on file    Family History:    Family History  Problem Relation Age of Onset  . Diabetes Father        died in his 8's  . Heart disease Mother        died of MI at 8, possible enlarged heart  . Heart attack Sister        possible valve disease. triple bypass at age 71, died in her 4's     ROS:  Please see the history of present illness.   All other ROS reviewed and negative.     Physical Exam/Data:   Vitals:   02/04/17 0515 02/04/17 0736 02/04/17 0900 02/04/17 1147  BP: 132/85 125/66 122/64   Pulse: 66 72 69 67  Resp: (!) 22 20 16 14   Temp:      TempSrc:      SpO2: 100% 100% 100% 99%  Weight:      Height:       No intake or output data in the 24 hours ending 02/04/17 1201 Filed Weights   02/03/17 1835  Weight: 225 lb (102.1 kg)   Body mass index is 35.24 kg/m.  General:  Well nourished, well developed, in no acute distress HEENT: normal Lymph: no adenopathy Neck: no JVD Endocrine:  No thryomegaly Vascular: No carotid bruits; FA pulses 2+ bilaterally without bruits  Cardiac:  normal S1, S2; RRR; 2-5/8 apical systolic murmur Lungs:  clear to auscultation bilaterally, no wheezing, rhonchi or rales  Abd: soft, nontender, no hepatomegaly  Ext: no  edema Musculoskeletal:  No deformities, BUE and BLE strength normal and equal Skin: warm and dry  Neuro:  CNs 2-12 intact, no focal abnormalities noted Psych:  Normal affect   EKG:  The EKG was personally reviewed and demonstrates:  Sinus rhythm 75 bpm, TWI in V1-2 and some mild T changes in V3, Probable left atrial enlargement, LAD, consider left anterior fascicular block, Abnormal R-wave progression, late transition, LVH with secondary repolarization abnormality Telemetry:  Telemetry was personally reviewed and demonstrates:  NSR 70's-80's  Relevant CV Studies:  Echocardiogram in process  Laboratory Data:  Chemistry Recent Labs  Lab  02/03/17 1854  NA 130*  K 3.4*  CL 94*  CO2 24  GLUCOSE 112*  BUN 19  CREATININE 1.19*  CALCIUM 8.9  GFRNONAA 52*  GFRAA >60  ANIONGAP 12    No results for input(s): PROT, ALBUMIN, AST, ALT, ALKPHOS, BILITOT in the last 168 hours. Hematology Recent Labs  Lab 02/03/17 1854  WBC 10.6*  RBC 4.50  HGB 14.0  HCT 40.4  MCV 89.8  MCH 31.1  MCHC 34.7  RDW 14.8  PLT 387   Cardiac Enzymes Recent Labs  Lab 02/04/17 0301 02/04/17 0846  TROPONINI 0.03* <0.03    Recent Labs  Lab 02/03/17 1919 02/03/17 2239 02/04/17 0151  TROPIPOC 0.03 0.03 0.03    BNPNo results for input(s): BNP, PROBNP in the last 168 hours.  DDimer  Recent Labs  Lab 02/04/17 0022  DDIMER 0.44    Radiology/Studies:  Dg Chest 2 View  Result Date: 02/03/2017 CLINICAL DATA:  Chest pain EXAM: CHEST  2 VIEW COMPARISON:  None. FINDINGS: Normal heart size. Normal mediastinal contour. No pneumothorax. No pleural effusion. Lungs appear clear, with no acute consolidative airspace disease and no pulmonary edema. IMPRESSION: No active cardiopulmonary disease. Electronically Signed   By: Ilona Sorrel M.D.   On: 02/03/2017 19:54    Assessment and Plan:   1. Chest pain -No previous cardiac history.  -EKG with TWI in V1-2 and some mild T changes in V3. No progression on  serial EKGs. Does have LVH with repolarization abnormality, abnormal R wave progression  -Troponins negative X5 (One mildly positive at 0.03, next one <0.03) -D-dimer not elevated. -Hgb normal, 14.0.  TSH 1.204 -CXR:  No active cardiopulmonary disease -Lipids in normal ranges.  -CVD risk factors: only risk factor is family hx of CAD -Symptoms are very atypical for ischemia with constant chest pressure, non-radiating. Also has had fatigue. Has some nausea and vomited X1 yesterday with RLQ cramping. No exertional symptoms. Treated for sinus infection over the previous week.  -Check echo for LV function, wall motion and valves.   2. Acute renal injury -SCr in Epic normal in 2015 and 2017.  -SCr 1.19 on admission. Being hydrated with IV fluids. Follow up labs -Was on HCTZ, now on hold.   3. Murmur -Pt has apical murmur, she has never been told that she has a murmur.  -Pt reported that one of her sisters had valve disease and needed a triple bypass at the age of 19.  -Check echocardiogram- has been done, waiting for results.   For questions or updates, please contact [place] Please consult [email or web address] for contact info under Cardiology/STEMI.   Signed, Daune Perch, NP  02/04/2017 12:01 PM

## 2017-02-04 NOTE — Progress Notes (Signed)
[facility] PROGRESS NOTE  Judy [name]  VXY:[id number] DOB: January 19, 1965 DOA: 02/03/2017 PCP: Antonietta Jewel, MD  Brief Narrative: Judy [name] is a 52 y.o. female with a FH of heart disease who presented to the ED for chest pain and right hand numbness. She has had exertional dyspnea worsening over the past few days as well. ECG showed LVH with abnormal repolarization and left sided TWI's, though troponins were cycled and negative. Cardiology consulted. 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   Subjective: Chest pain is improved, no dyspnea at rest.   Objective: BP 122/64   Pulse 60   Temp 98.1 F (36.7 C) (Oral)   Resp (!) 23   Ht 5\' 7"  (1.702 m)   Wt 102.1 kg (225 lb)   SpO2 100%   BMI 35.24 kg/m   Gen: Well-appearing, pleasant female in no distress Pulm: Clear and nonlabored on room air  CV: Regular, III/VI musical systolic murmur at the apex.  GI: Soft, NT, ND, +BS  Neuro: Alert and oriented. No focal deficits. Ext: Warm, no deformities Skin: No rashes, lesions, ulcers  Assessment & Plan: Chest pain with HOCM: Discussed with Dr. Sallyanne Kuster who read echo. ECG changes including TWI's in I and aVL, LVH w/repolarization abnormality in precordial leads, though not typical pain and cycled cardiac enzymes have been negative.  - Defer further evaluation to cardiology. Will give diet today, per discussions with Pecolia Ades, NP, and make NPO after midnight.  - LDL 99, HbA1c 5.9%, TSH 1.204  Right hand numbness: Strongly suspect carpal tunnel syndrome which she's been told before. Do not believe advanced imaging is indicated.  - Outpt follow up, recommend nightly splinting  AKI: Cr 1.19 from normal baseline. May be pseudoelevation due to bactrim.  - Monitor after IVF's provided - Completed course of bactrim  Hypokalemia: Mild.  - Recheck after replacement  Sinusitis: No systemic evidence of  infection. Completed treatment.   Franckowiak Gather, MD [facility]

## 2017-02-04 NOTE — Progress Notes (Signed)
  Echocardiogram 2D Echocardiogram has been performed.  Caran Storck T Coltrane Tugwell 02/04/2017, 8:36 AM

## 2017-02-04 NOTE — ED Provider Notes (Signed)
TIME SEEN: 12:25 AM  CHIEF COMPLAINT: Chest pain  HPI: Patient is a 52 year old female with no past medical history who presents to the emergency department chest pain.  States it is in the center and left side of her chest and described as a sharp, pressure-like pain that is worse with minimal movement, deep inspiration and palpation.  Pain is not exertional.  She states that she has had the pain for 3 days and has been constant.  It does wax and wane but never goes away completely.  She has had shortness of breath, nausea and vomiting after eating, dizziness and has some diaphoresis.  States that after eating earlier today she had burning in her chest which radiated into her throat and made her vomit.  No fever or cough.  No lower extremity swelling or pain.  No history of PE or DVT.  She states she had a stress test in November 2018 which was normal.  Martin Majestic to her primary care physician's office today who sent her to the emergency department for abnormal EKG.  She has never had an EKG before that she is aware of.  States that her sister had a triple bypass at 66 years old.  Patient is not a smoker.  She denies hypertension, diabetes, hyperlipidemia.  ROS: See HPI Constitutional: no fever  Eyes: no drainage  ENT: no runny nose   Cardiovascular:   chest pain  Resp:  SOB  GI:  vomiting GU: no dysuria Integumentary: no rash  Allergy: no hives  Musculoskeletal: no leg swelling  Neurological: no slurred speech ROS otherwise negative  PAST MEDICAL HISTORY/PAST SURGICAL HISTORY:  History reviewed. No pertinent past medical history.  MEDICATIONS:  Prior to Admission medications   Medication Sig Start Date End Date Taking? Authorizing Provider  amphetamine-dextroamphetamine (ADDERALL) 20 MG tablet Take 20 mg by mouth daily.    [provider]  Ascorbic Acid (VITAMIN C PO) Take by mouth.    [provider]  Cholecalciferol (VITAMIN D PO) Take 1-2 capsules by mouth daily.      [provider]  Glucosamine HCl (GLUCOSAMINE PO) Take by mouth.    [provider]  VITAMIN E PO Take 1 capsule by mouth daily.     [provider]    ALLERGIES:  Allergies  Allergen Reactions  . Naproxen Sodium Hives    ANAPROX    SOCIAL HISTORY:  Social History   Tobacco Use  . Smoking status: Never Smoker  . Smokeless tobacco: Never Used  Substance Use Topics  . Alcohol use: No    Alcohol/week: 0.0 oz    FAMILY HISTORY: Family History  Problem Relation Age of Onset  . Diabetes Father   . Heart disease Mother     EXAM: BP 128/63 (BP Location: Right Arm)   Pulse 79 Comment: Simultaneous filing. User may not have seen previous data.  Temp 98.1 F (36.7 C) (Oral)   Resp (!) 27 Comment: Simultaneous filing. User may not have seen previous data.  Ht 5\' 7"  (1.702 m)   Wt 102.1 kg (225 lb)   SpO2 99% Comment: Simultaneous filing. User may not have seen previous data.  BMI 35.24 kg/m  CONSTITUTIONAL: Alert and oriented and responds appropriately to questions. Well-appearing; well-nourished HEAD: Normocephalic EYES: Conjunctivae clear, pupils appear equal, EOMI ENT: normal nose; moist mucous membranes NECK: Supple, no meningismus, no nuchal rigidity, no LAD  CARD: RRR; S1 and S2 appreciated; no murmurs, no clicks, no rubs, no gallops CHEST:  Chest wall is tender to palpation over the left chest wall which reproduces her pain.  No crepitus, ecchymosis, erythema, warmth, rash or other lesions present.   RESP: Normal chest excursion without splinting or tachypnea; breath sounds clear and equal bilaterally; no wheezes, no rhonchi, no rales, no hypoxia or respiratory distress, speaking full sentences ABD/GI: Normal bowel sounds; non-distended; soft, non-tender, no rebound, no guarding, no peritoneal signs, no hepatosplenomegaly BACK:  The back appears normal and is non-tender to palpation, there is no CVA tenderness EXT: Normal ROM in all joints;  non-tender to palpation; no edema; normal capillary refill; no cyanosis, no calf tenderness or swelling    SKIN: Normal color for age and race; warm; no rash NEURO: Moves all extremities equally PSYCH: The patient's mood and manner are appropriate. Grooming and personal hygiene are appropriate.  MEDICAL DECISION MAKING: Patient here with somewhat atypical chest pain.  Seems to be musculoskeletal in nature as she is tender to palpation and it hurts more with movement.  It is not exertional.  She does have a significant family history of coronary artery disease with a sister requiring a triple bypass at 56 years old but she has no risk factors herself.  She did have an abnormal EKG but no old for comparison.  Reports normal stress test in November 2018.  She has had 2- troponins here in the setting of constant pain for 3 days.  Very low suspicion that she has having an MI at this time and I do not feel she necessarily needs admission ACS rule out.  Will obtain d-dimer given there is a pleuritic component to her chest pain.  Will give Toradol for pain control and reassess.  She does report that after eating she had a burning sensation in her throat which caused her to vomit.  She states however she was able to eat later today and did not have any symptoms.  No abdominal pain currently.  Could also be GI in nature.  She does however have a heart score of 4.  ED PROGRESS: I did discuss the case with the hospitalist.  Given she has a heart score for an abnormal EKG he has recommended observation admission.  Repeat EKG shows no significant change.  D-dimer here is negative.  Repeat troponin negative.  Patient comfortable with this plan.  Her pain did resolve after Toradol.  We did discuss that there are some very atypical factors to her chest pain but given she does have some risk factors and an abnormal EKG with no old for comparison we will observe her.  2:12 AM Discussed patient's case with hospitalist, Dr.  Blaine Hamper.  I have recommended admission and patient (and family if present) agree with this plan. Admitting physician will place admission orders.   I reviewed all nursing notes, vitals, pertinent previous records, EKGs, lab and urine results, imaging (as available).     EKG Interpretation  Date/Time:  Thursday February 03 2017 19:00:26 EST Ventricular Rate:  80 PR Interval:  144 QRS Duration: 92 QT Interval:  414 QTC Calculation: 477 R Axis:   78 Text Interpretation:  Normal sinus rhythm ST abnormalities in lateral and anterior leads No old tracing to compare Confirmed by Carlie Solorzano, Cyril Mourning [phone]) on 02/04/2017 12:00:06 AM         EKG Interpretation  Date/Time:  Friday February 04 2017 02:16:54 EST Ventricular Rate:  81 PR Interval:  144 QRS Duration: 95 QT Interval:  443 QTC Calculation: 515 R Axis:   89  Text Interpretation:  Sinus rhythm Abnormal R-wave progression, late transition Probable left ventricular hypertrophy Nonspecific T abnrm, anterolateral leads Prolonged QT interval No significant change since last tracing Confirmed by Paislynn Hegstrom, Cyril Mourning [phone]) on 02/04/2017 2:28:58 AM          Celese Banner, Delice Bison, DO 02/04/17 7530

## 2017-02-04 NOTE — ED Notes (Signed)
Pt eating breakfast tray. Family at bedside

## 2017-02-05 ENCOUNTER — Other Ambulatory Visit: Payer: Self-pay | Admitting: Student

## 2017-02-05 DIAGNOSIS — R9431 Abnormal electrocardiogram [ECG] [EKG]: Secondary | ICD-10-CM | POA: Diagnosis not present

## 2017-02-05 DIAGNOSIS — J329 Chronic sinusitis, unspecified: Secondary | ICD-10-CM | POA: Diagnosis not present

## 2017-02-05 DIAGNOSIS — N179 Acute kidney failure, unspecified: Secondary | ICD-10-CM | POA: Diagnosis not present

## 2017-02-05 DIAGNOSIS — I340 Nonrheumatic mitral (valve) insufficiency: Secondary | ICD-10-CM | POA: Diagnosis not present

## 2017-02-05 DIAGNOSIS — I421 Obstructive hypertrophic cardiomyopathy: Secondary | ICD-10-CM

## 2017-02-05 DIAGNOSIS — R202 Paresthesia of skin: Secondary | ICD-10-CM | POA: Diagnosis not present

## 2017-02-05 DIAGNOSIS — R079 Chest pain, unspecified: Secondary | ICD-10-CM | POA: Diagnosis not present

## 2017-02-05 MED ORDER — METOPROLOL TARTRATE 25 MG PO TABS: 12.5000 mg | ORAL_TABLET | Freq: Two times a day (BID) | ORAL | 0 refills | Status: DC

## 2017-02-05 MED ORDER — METOPROLOL TARTRATE 12.5 MG HALF TABLET: 12.5000 mg | ORAL_TABLET | Freq: Two times a day (BID) | ORAL | Status: DC

## 2017-02-05 NOTE — Plan of Care (Signed)
  Education: Knowledge of General Education information will improve 02/05/2017 1508 - Adequate for Discharge by Shanon Rosser, RN   Health Behavior/Discharge Planning: Ability to manage health-related needs will improve 02/05/2017 1508 - Adequate for Discharge by Shanon Rosser, RN   Clinical Measurements: Ability to maintain clinical measurements within normal limits will improve 02/05/2017 1508 - Adequate for Discharge by Shanon Rosser, RN Will remain free from infection 02/05/2017 1508 - Adequate for Discharge by Shanon Rosser, RN Diagnostic test results will improve 02/05/2017 1508 - Adequate for Discharge by Shanon Rosser, RN Respiratory complications will improve 02/05/2017 1508 - Adequate for Discharge by Shanon Rosser, RN Cardiovascular complication will be avoided 02/05/2017 1508 - Adequate for Discharge by Shanon Rosser, RN   Activity: Risk for activity intolerance will decrease 02/05/2017 1508 - Adequate for Discharge by Shanon Rosser, RN   Nutrition: Adequate nutrition will be maintained 02/05/2017 1508 - Adequate for Discharge by Shanon Rosser, RN   Coping: Level of anxiety will decrease 02/05/2017 1508 - Adequate for Discharge by Shanon Rosser, RN   Elimination: Will not experience complications related to bowel motility 02/05/2017 1508 - Adequate for Discharge by Shanon Rosser, RN Will not experience complications related to urinary retention 02/05/2017 1508 - Adequate for Discharge by Shanon Rosser, RN   Pain Managment: General experience of comfort will improve 02/05/2017 1508 - Adequate for Discharge by Shanon Rosser, RN   Safety: Ability to remain free from injury will improve 02/05/2017 1508 - Adequate for Discharge by Shanon Rosser, RN   Skin Integrity: Risk for impaired skin integrity will decrease 02/05/2017 1508 - Adequate for Discharge by Shanon Rosser, RN

## 2017-02-05 NOTE — Progress Notes (Signed)
Progress Note  Patient Name: Judy [name] Date of Encounter: 02/05/2017  Primary Cardiologist: No primary care provider on file.   Subjective   She is very upset about not receiving breakfast, but otherwise feels well.  She turns over on her right side she still feels a little bit of discomfort to the right of her sternum whenever she takes a deep breath, but otherwise does not have any chest discomfort.  She has reproducible tenderness over the right costochondral joints as well she is not short of breath walking in the room or washing up at the sink.  She has not had any arrhythmia on telemetry.  Cardiac MRI ordered, but will not be done this weekend.  Relatively bradycardic at rest with a heart rate around 60-65 bpm.  Inpatient Medications    Scheduled Meds: . aspirin EC  325 mg Oral Daily  . enoxaparin (LOVENOX) injection  40 mg Subcutaneous Q24H   Continuous Infusions: . sodium chloride 75 mL/hr at 02/04/17 1948   PRN Meds: acetaminophen, ALPRAZolam, morphine injection, nitroGLYCERIN, ondansetron (ZOFRAN) IV, zolpidem   Vital Signs    Vitals:   02/04/17 1200 02/04/17 1400 02/04/17 1955 02/05/17 0500  BP:  118/72 132/63 132/63  Pulse: 60 86 84 82  Resp: (!) 23 18 15 18   Temp:  98.3 F (36.8 C) 97.7 F (36.5 C) 97.6 F (36.4 C)  TempSrc:  Oral Oral Oral  SpO2: 100% 98% 99% 99%  Weight:    225 lb 0 oz (102.1 kg)  Height:        Intake/Output Summary (Last 24 hours) at 02/05/2017 1145 Last data filed at 02/05/2017 7062 Gross per 24 hour  Intake 1658.75 ml  Output 420 ml  Net 1238.75 ml   Filed Weights   02/03/17 1835 02/05/17 0500  Weight: 225 lb (102.1 kg) 225 lb 0 oz (102.1 kg)    Telemetry    Normal sinus rhythm- Personally Reviewed  ECG    Normal sinus rhythm, voltage criteria for LVH, anterolateral ST-T changes may be due to LVH but cannot rule out ischemia- Personally Reviewed  Physical Exam  Appears well, comfortable GEN: No acute distress.    Neck: No JVD Cardiac: RRR, 3/6 left parasternal and apical murmur with midsystolic peaking and intensified by the Valsalva maneuver, no rubs or gallops.  Respiratory: Clear to auscultation bilaterally. GI: Soft, nontender, non-distended  MS: No edema; No deformity. Neuro:  Nonfocal  Psych: Normal affect   Labs    Chemistry Recent Labs  Lab 02/03/17 1854  NA 130*  K 3.4*  CL 94*  CO2 24  GLUCOSE 112*  BUN 19  CREATININE 1.19*  CALCIUM 8.9  GFRNONAA 52*  GFRAA >60  ANIONGAP 12     Hematology Recent Labs  Lab 02/03/17 1854  WBC 10.6*  RBC 4.50  HGB 14.0  HCT 40.4  MCV 89.8  MCH 31.1  MCHC 34.7  RDW 14.8  PLT 387    Cardiac Enzymes Recent Labs  Lab 02/04/17 0301 02/04/17 0846 02/04/17 1443  TROPONINI 0.03* <0.03 <0.03    Recent Labs  Lab 02/03/17 1919 02/03/17 2239 02/04/17 0151  TROPIPOC 0.03 0.03 0.03     BNPNo results for input(s): BNP, PROBNP in the last 168 hours.   DDimer  Recent Labs  Lab 02/04/17 0022  DDIMER 0.44     Radiology    Dg Chest 2 View  Result Date: 02/03/2017 CLINICAL DATA:  Chest pain EXAM: CHEST  2 VIEW COMPARISON:  None. FINDINGS: Normal heart size. Normal mediastinal contour. No pneumothorax. No pleural effusion. Lungs appear clear, with no acute consolidative airspace disease and no pulmonary edema. IMPRESSION: No active cardiopulmonary disease. Electronically Signed   By: Ilona Sorrel M.D.   On: 02/03/2017 19:54    Cardiac Studies   Echo 02/04/2017  - Left ventricle: The cavity size was normal. Moderately increased   relative wall thickness. There was moderate asymmetric   hypertrophy of the septum. The appearance was consistent with   hypertrophic cardiomyopathy. Systolic function was vigorous. The   estimated ejection fraction was in the range of 65% to 70%.   Dynamic obstruction cannot be excluded. Wall motion was normal;   there were no regional wall motion abnormalities. Features are   consistent with a  pseudonormal left ventricular filling pattern,   with concomitant abnormal relaxation and increased filling   pressure (grade 2 diastolic dysfunction). - Mitral valve: There was moderate systolic anterior motion of the   anterior leaflet. There was moderate to severe regurgitation   directed eccentrically and posteriorly. - Left atrium: The atrium was mildly dilated.  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  Recommendations:  Reevaluate LV outflow obstruction following provocative maneuvers (e.g. Valsalva maneuver).  Stress Echo  Study Conclusions  - Stress ECG conclusions: There were no stress arrhythmias or   conduction abnormalities. The stress ECG was normal. The   sensitivity of this test was limited by a failure to achieve   target heart rate. - Staged echo: Findings are consistent with hypertrophic   obstructive cardiomyopathy with a relatively mild inducible LVOT   obstructive gradient. There is no evidence of exercise induced   regional wall motion abnormalities. There is a small inducible   (dynamic) LVOT gradient, maximum 36 mm Hg immediately after   exercise.  Impressions:  - No evidence of exercise induced ischemia.   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   Patient Profile     52 y.o. female with probable HOCM, MR dominant lesion rather than LVOT obstruction. Mildly symptomatic. No high risk features for sudden arrhythmic death.  Assessment & Plan    Start low dose beta blockers (will be limited by bradycardia), bring back for outpatient cardiac MR and f/u with Dr. Oval Linsey.  For questions or updates, please contact [place] Please consult [email or web address] for contact info under  Cardiology/STEMI.      Signed, Sanda Klein, MD  02/05/2017, 11:45 AM

## 2017-02-05 NOTE — Discharge Summary (Signed)
Physician Discharge Summary  Judy [name] TGG:[id number] DOB: 10/15/1965 DOA: 02/03/2017  PCP: Antonietta Jewel, MD  Admit date: 02/03/2017 Discharge date: 02/05/2017  Admitted From: Home Disposition: Home   Recommendations for Outpatient Follow-up:  1. Follow up with cardiology with cardiac MRI. Started low dose beta blocker (will be limited by bradycardia) due to suspected HOCM.   Home Health: None Equipment/Devices: None Discharge Condition: Stable CODE STATUS: Full Diet recommendation: Heart healthy  Brief/Interim Summary: Judy [name] is a 52 y.o. female with a FH of heart disease who presented to the ED for chest pain and right hand numbness. She has had exertional dyspnea worsening over the past few days as well. ECG showed LVH with abnormal repolarization and left sided TWI's, though troponins were cycled and negative. Cardiology consulted. 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 She has reproducible tenderness over the right costochondral joints as well she is not short of breath walking in the room or washing up at the sink.  She has not had any arrhythmia on telemetry. Cardiac MRI was planned, though will not be done as inpatient.   Discharge Diagnoses:  Principal Problem:   Nonspecific chest pain Active Problems:   Sinusitis   Right arm numbness   AKI (acute kidney injury) ([place])   Hypokalemia   HOCM (hypertrophic obstructive cardiomyopathy) (HCC)  Chest pain with probable HOCM, MR dominant rather than LVOT obstruction based on stress echo. No arrhythmias on telemetry, symptoms resolved.  - Started beta blocker - Cardiac MRI as outpatient - Follow up with cardiology - LDL 99, HbA1c 5.9%, TSH 1.204  Right hand numbness: Strongly suspect carpal tunnel syndrome which she's been told before. Do not believe advanced imaging is indicated.  - Outpt follow up, recommend nightly splinting  AKI: Cr  1.19 from normal baseline. May be pseudoelevation due to bactrim.  - Recheck at follow up. - Completed course of bactrim  Hypokalemia: Mild. Replaced.  - Recheck at follow up  Sinusitis: No systemic evidence of infection. Completed treatment.   Discharge Instructions Discharge Instructions    Diet - low sodium heart healthy   Complete by:  As directed    Discharge instructions   Complete by:  As directed    You were evaluated for chest pain, found to have evidence of hypertrophic obstructive cardiomyopathy. The stress echo did not show concerning features and there was no evidence of abnormal heart rhythms on telemetry that would suggest an elevated risk of cardiac arrest. You will need a cardiac MRI, though this is not urgent. Cardiology has evaluated you, and feels you are stable for discharge. You will be contacted to schedule follow up with their office and to perform the cardiac MRI as an outpatient. Medication recommendations are as follows:  - STOP taking hydrochlorothiazide as this can worsen HOCM symptoms - START taking metoprolol twice daily as prescribed  If your symptoms worsen, seek medical attention right away.   Increase activity slowly   Complete by:  As directed      Allergies as of 02/05/2017      Reactions   Naproxen Sodium Hives   ANAPROX      Medication List    STOP taking these medications   hydrochlorothiazide 25 MG tablet Commonly known as:  HYDRODIURIL   sulfamethoxazole-trimethoprim 800-160 MG tablet Commonly known as:  BACTRIM DS,SEPTRA DS     TAKE these medications   metoprolol tartrate 25  MG tablet Commonly known as:  LOPRESSOR Take 0.5 tablets (12.5 mg total) by mouth 2 (two) times daily.      Follow-up Information    Skeet Latch, MD Follow up.   Specialty:  Cardiology Why:  The office will contact you to arrange for your Cardiac MRI along with follow-up after testing has been completed.  Contact information: [address] [place] [address] [phone]        Antonietta Jewel, MD Follow up.   Specialty:  Internal Medicine Contact information: [address] [place] [address] [phone]          Allergies  Allergen Reactions  . Naproxen Sodium Hives    ANAPROX    Consultations:  Cardiology  Procedures/Studies: Dg Chest 2 View  Result Date: 02/03/2017 CLINICAL DATA:  Chest pain EXAM: CHEST  2 VIEW COMPARISON:  None. FINDINGS: Normal heart size. Normal mediastinal contour. No pneumothorax. No pleural effusion. Lungs appear clear, with no acute consolidative airspace disease and no pulmonary edema. IMPRESSION: No active cardiopulmonary disease. Electronically Signed   By: Ilona Sorrel M.D.   On: 02/03/2017 19:54    Echo 02/04/2017  - Left ventricle: The cavity size was normal. Moderately increased relative wall thickness. There was moderate asymmetric hypertrophy of the septum. The appearance was consistent with hypertrophic cardiomyopathy. Systolic function was vigorous. The estimated ejection fraction was in the range of 65% to 70%. Dynamic obstruction cannot be excluded. Wall motion was normal; there were no regional wall motion abnormalities. Features are consistent with a pseudonormal left ventricular filling pattern, with concomitant abnormal relaxation and increased filling pressure (grade 2 diastolic dysfunction). - Mitral valve: There was moderate systolic anterior motion of the anterior leaflet. There was moderate to severe regurgitation directed eccentrically and posteriorly. - Left atrium: The atrium was mildly dilated.  Impressions:  - Findings are consistent with hypertrophic obstructive cardiomyopathy, although mitral insufficiency due to [facility] is the dominant hemodynamic abnormality, rather than ouflow tract obstruction.  Stress Echo 02/04/2017:  Study Conclusions  - Stress ECG conclusions: There were no  stress arrhythmias or conduction abnormalities. The stress ECG was normal. The sensitivity of this test was limited by a failure to achieve target heart rate. - Staged echo: Findings are consistent with hypertrophic obstructive cardiomyopathy with a relatively mild inducible LVOT obstructive gradient. There is no evidence of exercise induced regional wall motion abnormalities. There is a small inducible (dynamic) LVOT gradient, maximum 36 mm Hg immediately after exercise.  Impressions:  - No evidence of exercise induced ischemia. Findings are consistent with hypertrophic obstructive cardiomyopathy with a relatively mild inducible LVOT obstructive gradient.  Subjective: Feels well, wants to leave the hospital. No dyspnea.   Discharge Exam: Vitals:   02/05/17 0500 02/05/17 1329  BP: 132/63 (!) 131/59  Pulse: 82 75  Resp: 18   Temp: 97.6 F (36.4 C) 97.7 F (36.5 C)  SpO2: 99% 100%   General: Pt is alert, awake, not in acute distress Cardiovascular: RRR, III/VI apical systolic murmur, musical, intensified with valsalva.  Respiratory: CTA bilaterally, no wheezing, no rhonchi Abdominal: Soft, NT, ND, bowel sounds + Extremities: No edema, no cyanosis  Labs: Basic Metabolic Panel: Recent Labs  Lab 02/03/17 1854  NA 130*  K 3.4*  CL 94*  CO2 24  GLUCOSE 112*  BUN 19  CREATININE 1.19*  CALCIUM 8.9   CBC: Recent Labs  Lab 02/03/17 1854  WBC 10.6*  HGB 14.0  HCT 40.4  MCV 89.8  PLT 387   Cardiac Enzymes: Recent Labs  Lab 02/04/17 0301 02/04/17 0846 02/04/17 1443  TROPONINI 0.03* <0.03 <0.03   CBG: Recent Labs  Lab 02/04/17 2057  GLUCAP 122*   D-Dimer Recent Labs    02/04/17 0022  DDIMER 0.44   Hgb A1c Recent Labs    02/04/17 0301  HGBA1C 5.9*   Lipid Profile Recent Labs    02/04/17 0301  CHOL 159  HDL 34*  LDLCALC 99  TRIG 129  CHOLHDL 4.7   Thyroid function studies Recent Labs    02/04/17 0301  TSH  1.204   Anemia work up Recent Labs    02/04/17 0301  VITAMINB12 1,097*  FOLATE 9.2   Urinalysis    Component Value Date/Time   COLORURINE DARK YELLOW 05/01/2015 [place] 05/01/2015 1214   LABSPEC 1.032 05/01/2015 1214   PHURINE 6.0 05/01/2015 [place] 05/01/2015 [place] 05/01/2015 1214   [place] NEGATIVE 05/01/2015 [place] (A) 05/01/2015 [place] 05/01/2015 1214   UROBILINOGEN 1 04/14/2012 1525   NITRITE NEGATIVE 05/01/2015 1214   LEUKOCYTESUR NEGATIVE 05/01/2015 1214    Time coordinating discharge: Approximately 40 minutes  Crehan Gather, MD  [facility] 02/06/2017, 2:58 PM Pager [phone]

## 2017-02-05 NOTE — Plan of Care (Signed)
  Education: Knowledge of General Education information will improve 02/05/2017 0425 - Progressing by Anson Fret, RN Note POC reviewed with pt./ family, and NPO after mn.

## 2017-02-05 NOTE — Progress Notes (Signed)
Informed pt. and family she can't have anything to eat or drink per MD d/t possible test today.

## 2017-02-08 ENCOUNTER — Telehealth: Payer: Self-pay | Admitting: Cardiovascular Disease

## 2017-02-08 ENCOUNTER — Encounter: Payer: Self-pay | Admitting: Student

## 2017-02-08 NOTE — Telephone Encounter (Signed)
Called patient and LVM regarding her cardiac MRI scheduled 02-15-17 at 3 p.m., at [facility].  Letter mailed to the patient.

## 2017-02-10 ENCOUNTER — Telehealth: Payer: Self-pay | Admitting: Cardiovascular Disease

## 2017-02-10 NOTE — Telephone Encounter (Signed)
Closed Encounter  °

## 2017-02-15 ENCOUNTER — Ambulatory Visit (HOSPITAL_COMMUNITY): Payer: BLUE CROSS/BLUE SHIELD

## 2017-03-03 ENCOUNTER — Encounter: Payer: Self-pay | Admitting: Cardiology

## 2017-03-03 ENCOUNTER — Ambulatory Visit: Payer: BLUE CROSS/BLUE SHIELD | Admitting: Cardiology

## 2017-03-03 ENCOUNTER — Ambulatory Visit (INDEPENDENT_AMBULATORY_CARE_PROVIDER_SITE_OTHER): Payer: BLUE CROSS/BLUE SHIELD | Admitting: Cardiology

## 2017-03-03 DIAGNOSIS — I421 Obstructive hypertrophic cardiomyopathy: Secondary | ICD-10-CM

## 2017-03-03 DIAGNOSIS — R079 Chest pain, unspecified: Secondary | ICD-10-CM | POA: Diagnosis not present

## 2017-03-03 DIAGNOSIS — Z8279 Family history of other congenital malformations, deformations and chromosomal abnormalities: Secondary | ICD-10-CM

## 2017-03-03 DIAGNOSIS — Z8249 Family history of ischemic heart disease and other diseases of the circulatory system: Secondary | ICD-10-CM

## 2017-03-03 MED ORDER — METOPROLOL TARTRATE 25 MG PO TABS: 12.5000 mg | ORAL_TABLET | Freq: Two times a day (BID) | ORAL | 3 refills | Status: DC

## 2017-03-03 NOTE — Assessment & Plan Note (Signed)
Mild by echo 

## 2017-03-03 NOTE — Assessment & Plan Note (Signed)
Negative stress echo though she did have mild HOCM  She tells me she had just finished a steroid dose pack before her admission for sinusitis ? If her symptoms were secondary to GERD  She also admitted she takes HCTZ 25 mg for "swelling" in her hands. It possible she was dehydrated and that made her HOCM worse causing her symptoms.

## 2017-03-03 NOTE — Progress Notes (Signed)
03/03/2017 Judy [name]   29-Jul-1965  [id number]  Primary Physician Antonietta Jewel, MD Primary Cardiologist: Dr Oval Linsey  HPI:  52 y/o AA female admitted to [facility] from Urgent Care 02/04/17 with chest pain and an abnormal EKG. Her Troponin were normal. Stress echo did not show significant ischemia but she was found to have a relatively mild inducible LVOT obstructive gradient. She was placed on Lopressor 12.5 mg BID. She takes ASA and HCTZ on her own. Since discharge she has done well, no  Further chest pain.    Current Outpatient Medications  Medication Sig Dispense Refill  . hydrochlorothiazide (HYDRODIURIL) 25 MG tablet Take 25 mg by mouth every morning.  11  . metoprolol tartrate (LOPRESSOR) 25 MG tablet Take 0.5 tablets (12.5 mg total) by mouth 2 (two) times daily. 90 tablet 3   No current facility-administered medications for this visit.     Allergies  Allergen Reactions  . Naproxen Sodium Hives    ANAPROX    Past Medical History:  Diagnosis Date  . HOCM (hypertrophic obstructive cardiomyopathy) ([place]) 02/04/2017    Social History   Socioeconomic History  . Marital status: Single    Spouse name: Not on file  . Number of children: Not on file  . Years of education: Not on file  . Highest education level: Not on file  Social Needs  . Financial resource strain: Not on file  . Food insecurity - worry: Not on file  . Food insecurity - inability: Not on file  . Transportation needs - medical: Not on file  . Transportation needs - non-medical: Not on file  Occupational History  . Not on file  Tobacco Use  . Smoking status: Never Smoker  . Smokeless tobacco: Never Used  Substance and Sexual Activity  . Alcohol use: No    Alcohol/week: 0.0 oz  . Drug use: No  . Sexual activity: Yes    Birth control/protection: Surgical    Comment: BTL-1st intercourse 52 yo-Fewer than 5 partners  Other Topics Concern  . Not on file  Social History Narrative  . Not on file      Family History  Problem Relation Age of Onset  . Diabetes Father        died in his 43's  . Heart disease Mother        died of MI at 12, possible enlarged heart  . Heart attack Sister        possible valve disease. triple bypass at age 105, died in her 42's     Review of Systems: General: negative for chills, fever, night sweats or weight changes.  Cardiovascular: negative for chest pain, dyspnea on exertion, edema, orthopnea, palpitations, paroxysmal nocturnal dyspnea or shortness of breath Dermatological: negative for rash Respiratory: negative for cough or wheezing Urologic: negative for hematuria Abdominal: negative for nausea, vomiting, diarrhea, bright red blood per rectum, melena, or hematemesis Neurologic: negative for visual changes, syncope, or dizziness All other systems reviewed and are otherwise negative except as noted above.    Blood pressure 119/73, pulse 77, height 5\' 7"  (1.702 m), weight 228 lb 9.6 oz (103.7 kg).  General appearance: alert, cooperative and no distress Neck: no carotid bruit and no JVD Lungs: clear to auscultation bilaterally Heart: regular rate and rhythm and 4-2/7 systolic murmur LSB- no change with val salva Extremities: extremities normal, atraumatic, no cyanosis or edema Skin: Skin color, texture, turgor normal. No rashes or lesions Neurologic: Grossly normal  EKG NSR, TWI 1,  AVL, V2  ASSESSMENT AND PLAN:   HOCM (hypertrophic obstructive cardiomyopathy) (HCC) Mild by echo.   Chest pain Negative stress echo though she did have mild HOCM  She tells me she had just finished a steroid dose pack before her admission for sinusitis ? If her symptoms were secondary to GERD  She also admitted she takes HCTZ 25 mg for "swelling" in her hands. It possible she was dehydrated and that made her HOCM worse causing her symptoms.   Family history of congenital heart disease in sister Her sister had heart surgery at [facility] in her 45's and died in  her 69's of "heart failure"   PLAN  I suggested she stop her ASA, continue Lopressor 12.5 mg BID, and stop her HCTZ. She was reluctant to stop her HCTZ secondary to "hand swelling". She did agree to cut the dose back to 12.5 mg PRN. We discussed the importance of staying hydrated and we discussed exercise (walking for exercise). She knows to contact us if she has exertional chest pain, DOE, or syncope. She needs an echo in a year, f/u Dr Oval Linsey 6 months.   Kerin Ransom PA-C 03/03/2017 3:59 PM

## 2017-03-03 NOTE — Patient Instructions (Signed)
Medication Instructions:  DECREASE- HCTz 12.5 mg daily as needed for swelling  If you need a refill on your cardiac medications before your next appointment, please call your pharmacy.  Labwork: None Ordered   Testing/Procedures: None Ordered  Follow-Up: Your physician wants you to follow-up in: 6 Months with Dr Oval Linsey. You should receive a reminder letter in the mail two months in advance. If you do not receive a letter, please call our office [phone].    Thank you for choosing [facility] at [facility]!!

## 2017-03-03 NOTE — Assessment & Plan Note (Signed)
Her sister had heart surgery at [facility] in her 42's and died in her 58's of "heart failure"

## 2017-08-03 ENCOUNTER — Ambulatory Visit (INDEPENDENT_AMBULATORY_CARE_PROVIDER_SITE_OTHER): Payer: BLUE CROSS/BLUE SHIELD | Admitting: Gynecology

## 2017-08-03 ENCOUNTER — Encounter: Payer: Self-pay | Admitting: Gynecology

## 2017-08-03 VITALS — BP 136/86 | Ht 67.0 in | Wt 232.0 lb

## 2017-08-03 DIAGNOSIS — Z1151 Encounter for screening for human papillomavirus (HPV): Secondary | ICD-10-CM | POA: Diagnosis not present

## 2017-08-03 DIAGNOSIS — Z01419 Encounter for gynecological examination (general) (routine) without abnormal findings: Secondary | ICD-10-CM

## 2017-08-03 DIAGNOSIS — N951 Menopausal and female climacteric states: Secondary | ICD-10-CM

## 2017-08-03 DIAGNOSIS — Z13220 Encounter for screening for lipoid disorders: Secondary | ICD-10-CM

## 2017-08-03 NOTE — Addendum Note (Signed)
Addended by: Stephens Shire on: 08/03/2017 05:20 PM   Modules accepted: Orders

## 2017-08-03 NOTE — Progress Notes (Signed)
    Judy [name] August 17, 1965 [id number]        51 y.o.  [id number] for annual gynecologic exam.  Without gynecologic complaints.  Notes her last menstrual period was in January.  Has been spacing out her menses over the past year.  Does not have significant hot flushes or sweats.  Judy [name] 2 years ago was 66.  Past medical history,surgical history, problem list, medications, allergies, family history and social history were all reviewed and documented as reviewed in the EPIC chart.  ROS:  Performed with pertinent positives and negatives included in the history, assessment and plan.   Additional significant findings : None   Exam: Judy [name] assistant Vitals:   08/03/17 1639  BP: 136/86  Weight: 232 lb (105.2 kg)  Height: 5\' 7"  (1.702 m)   Body mass index is 36.34 kg/m.  General appearance:  Normal affect, orientation and appearance. Skin: Grossly normal HEENT: Without gross lesions.  No cervical or supraclavicular adenopathy. Thyroid normal.  Lungs:  Clear without wheezing, rales or rhonchi Cardiac: RR, without RMG Abdominal:  Soft, nontender, without masses, guarding, rebound, organomegaly or hernia Breasts:  Examined lying and sitting without masses, retractions, discharge or axillary adenopathy. Pelvic:  Ext, BUS, Vagina: Normal  Cervix: Normal.  Pap smear/HPV  Uterus: Anteverted, normal size, shape and contour, midline and mobile nontender   Adnexa: Without masses or tenderness    Anus and perineum: Normal   Rectovaginal: Normal sphincter tone without palpated masses or tenderness.    Assessment/Plan:  52 y.o. [id number] female for annual gynecologic exam.   1. Perimenopausal.  Menses have spaced out.  No significant hot flushes or sweats.  Reviewed the perimenopause with the patient and what to expect.  If she develops significant menopausal symptoms she will represent to discuss HRT.  If she has prolonged or atypical bleeding or goes more than 1 year without bleeding and then  bleeds she knows to call. 2. Pap smear/HPV 2014.  Pap smear/HPV today.  No history of significant abnormal Pap smears.  Will continue with every 5-year Pap smear/HPV surveillance per current screening guidelines. 3. Mammography 2016.  Patient knows she is way overdue and has a mammogram scheduled.  Breast exam normal today. 4. Colonoscopy never.  Plans to schedule this coming year.  Names and numbers given. 5. DEXA never.  Will plan further into the menopause.  Increased calcium vitamin D reviewed. 6. Health maintenance.  Future orders for fasting CBC, CMP, lipid profile, hemoglobin A 1C placed.  Patient will follow-up fasting.  Follow-up in 1 year for annual exam.   Anastasio Auerbach MD, 5:11 PM 08/03/2017

## 2017-08-03 NOTE — Patient Instructions (Signed)
Schedule your colonoscopy with either:  [facility]   Address: [address], [place], [place] [address]  [phone]    or  [facility]  Address: [address], [place],  [address]  [phone]      

## 2017-08-04 LAB — CBC WITH DIFFERENTIAL/PLATELET
BASOS ABS: 52 {cells}/uL (ref 0–200)
Basophils Relative: 0.7 %
Eosinophils Absolute: 200 cells/uL (ref 15–500)
Eosinophils Relative: 2.7 %
HEMATOCRIT: 38 % (ref 35.0–45.0)
Hemoglobin: 13 g/dL (ref 11.7–15.5)
LYMPHS ABS: 3596 {cells}/uL (ref 850–3900)
MCH: 30.4 pg (ref 27.0–33.0)
MCHC: 34.2 g/dL (ref 32.0–36.0)
MCV: 88.8 fL (ref 80.0–100.0)
MPV: 11 fL (ref 7.5–12.5)
Monocytes Relative: 6.8 %
Neutro Abs: 3049 cells/uL (ref 1500–7800)
Neutrophils Relative %: 41.2 %
Platelets: 326 10*3/uL (ref 140–400)
RBC: 4.28 10*6/uL (ref 3.80–5.10)
RDW: 13.3 % (ref 11.0–15.0)
Total Lymphocyte: 48.6 %
WBC mixed population: 503 cells/uL (ref 200–950)
WBC: 7.4 10*3/uL (ref 3.8–10.8)

## 2017-08-04 LAB — LIPID PANEL
CHOLESTEROL: 203 mg/dL — AB (ref <200)
HDL: 57 mg/dL (ref >50)
LDL Cholesterol (Calc): 129 mg/dL (calc) — ABNORMAL HIGH
NON-HDL CHOLESTEROL (CALC): 146 mg/dL — AB (ref <130)
Total CHOL/HDL Ratio: 3.6 (calc) (ref <5.0)
Triglycerides: 74 mg/dL (ref <150)

## 2017-08-04 LAB — HEMOGLOBIN A1C
EAG (MMOL/L): 6.6 (calc)
Hgb A1c MFr Bld: 5.8 % of total Hgb — ABNORMAL HIGH (ref <5.7)
Mean Plasma Glucose: 120 (calc)

## 2017-08-04 LAB — PAP IG AND HPV HIGH-RISK: HPV DNA High Risk: NOT DETECTED

## 2017-08-04 LAB — COMPREHENSIVE METABOLIC PANEL
AG Ratio: 1.3 (calc) (ref 1.0–2.5)
ALKALINE PHOSPHATASE (APISO): 83 U/L (ref 33–130)
ALT: 15 U/L (ref 6–29)
AST: 24 U/L (ref 10–35)
Albumin: 4.2 g/dL (ref 3.6–5.1)
BUN: 10 mg/dL (ref 7–25)
CHLORIDE: 102 mmol/L (ref 98–110)
CO2: 31 mmol/L (ref 20–32)
Calcium: 9.5 mg/dL (ref 8.6–10.4)
Creat: 0.87 mg/dL (ref 0.50–1.05)
Globulin: 3.3 g/dL (calc) (ref 1.9–3.7)
Glucose, Bld: 83 mg/dL (ref 65–99)
Potassium: 4.5 mmol/L (ref 3.5–5.3)
Sodium: 139 mmol/L (ref 135–146)
Total Bilirubin: 0.6 mg/dL (ref 0.2–1.2)
Total Protein: 7.5 g/dL (ref 6.1–8.1)

## 2017-08-05 ENCOUNTER — Other Ambulatory Visit: Payer: Self-pay | Admitting: Gynecology

## 2017-08-05 DIAGNOSIS — E7800 Pure hypercholesterolemia, unspecified: Secondary | ICD-10-CM

## 2018-01-19 ENCOUNTER — Encounter: Payer: Self-pay | Admitting: Cardiology

## 2018-02-23 ENCOUNTER — Telehealth: Payer: Self-pay

## 2018-02-23 NOTE — Telephone Encounter (Signed)
New message    Just an FYI. We have made several attempts to contact this patient including sending a letter to schedule or reschedule their echocardiogram. We will be removing the patient from the echo WQ.   Thank you 

## 2018-02-25 ENCOUNTER — Ambulatory Visit (HOSPITAL_COMMUNITY)
Admission: EM | Admit: 2018-02-25 | Discharge: 2018-02-25 | Disposition: A | Discharge status: Home / Self Care | Payer: BLUE CROSS/BLUE SHIELD | Attending: Family Medicine | Admitting: Family Medicine

## 2018-02-25 ENCOUNTER — Ambulatory Visit (INDEPENDENT_AMBULATORY_CARE_PROVIDER_SITE_OTHER): Payer: BLUE CROSS/BLUE SHIELD

## 2018-02-25 ENCOUNTER — Other Ambulatory Visit: Payer: Self-pay

## 2018-02-25 ENCOUNTER — Encounter (HOSPITAL_COMMUNITY): Payer: Self-pay

## 2018-02-25 DIAGNOSIS — M25512 Pain in left shoulder: Secondary | ICD-10-CM

## 2018-02-25 MED ORDER — PREDNISONE 10 MG (21) PO TBPK: ORAL_TABLET | Freq: Every day | ORAL | 0 refills | Status: DC

## 2018-02-25 NOTE — ED Provider Notes (Signed)
[facility]   [id number] 02/25/18 Arrival Time: 7510  ASSESSMENT & PLAN:  1. Acute pain of left shoulder    Non-traumatic. Questions inflammatory, strain, muscle spasm? Discussed. No suspicion for cardiac/respiratory cause. Reassured.  I have personally viewed the imaging studies ordered this visit. Normal shoulder without dislocation.  Trial of: Meds ordered this encounter  Medications  . predniSONE (STERAPRED UNI-PAK 21 TAB) 10 MG (21) TBPK tablet    Sig: Take by mouth daily. Take as directed.    Dispense:  21 tablet    Refill:  0   Will do her best to ensure adequate ROM.  Follow-up Information    Schedule an appointment as soon as possible for a visit  with Renette Butters, MD.   Specialty:  Orthopedic Surgery Contact information: [address] [place] [address] [phone]           Reviewed expectations re: course of current medical issues. Questions answered. Outlined signs and symptoms indicating need for more acute intervention. Patient verbalized understanding. After Visit Summary given.  SUBJECTIVE: History from: patient. Judy [name] is a 53 y.o. female who reports fairly persistent moderate pain of her left shoulder, "just hurts all around my shoulder"; described as aching without radiation. Onset: abrupt, today; awoke with pain; pain did not wake her. Injury/trama: no. Symptoms have progressed to a point and plateaued since beginning. Aggravating factors: movement. Alleviating factors: rest. Associated symptoms: none reported. Extremity sensation changes or weakness: none. Self treatment: tried OTCs without relief of pain. History of similar: no. No CP or SOB. No n/v. No specific neck pain.  Past Surgical History:  Procedure Laterality Date  . BARTHOLIN CYST MARSUPIALIZATION    . CESAREAN SECTION     X3 with BTL  . DILATATION & CURETTAGE/HYSTEROSCOPY WITH TRUECLEAR N/A 04/12/2013   Procedure: DILATATION  & CURETTAGE/HYSTEROSCOPY ;  Surgeon: Anastasio Auerbach, MD;  Location: [facility];  Service: Gynecology;  Laterality: N/A;  . TUBAL LIGATION       ROS: As per HPI. All other systems negative.   OBJECTIVE:  Vitals:   02/25/18 1553 02/25/18 1555  BP:  (!) 144/81  Pulse:  67  Resp:  18  Temp:  98.7 F (37.1 C)  TempSrc:  Oral  SpO2:  100%  Weight: 101.6 kg     General appearance: alert; no distress HEENT: [place]; AT Neck: supple with FROM; no muscular tenderness Back: no muscular tenderness; FROM at waist CV: RRR Lungs: CTAB; unlabored respirations Extremities: . LUE: warm and well perfused; poorly localized moderate tenderness over left shoulder extending in trapezius distribution; without gross deformities; with no swelling; with no bruising; ROM: limited by pain; trouble with abduction CV: brisk extremity capillary refill of RUE and LUE; 2+ radial pulse of LUE. Skin: warm and dry; no visible rashes Neurologic: gait normal; normal reflexes of RUE and LUE; normal sensation of RUE and LUE; normal strength of RUE and LUE Psychological: alert and cooperative; normal mood and affect  Imaging: Dg Shoulder Left  Result Date: 02/25/2018 CLINICAL DATA:  Woke up with shoulder pain and limited range of movement. EXAM: LEFT SHOULDER - 2+ VIEW COMPARISON:  None. FINDINGS: There is no evidence of fracture or dislocation. There is no evidence of arthropathy or other focal bone abnormality. Soft tissues are unremarkable. IMPRESSION: Negative. Electronically Signed   By: Franki Cabot M.D.   On: 02/25/2018 17:01   Allergies  Allergen Reactions  . Naproxen Sodium Hives  ANAPROX    Past Medical History:  Diagnosis Date  . HOCM (hypertrophic obstructive cardiomyopathy) ([place]) 02/04/2017   Social History   Socioeconomic History  . Marital status: Single    Spouse name: Not on file  . Number of children: Not on file  . Years of education: Not on file  . Highest education level: Not on file    Occupational History  . Not on file  Social Needs  . Financial resource strain: Not on file  . Food insecurity:    Worry: Not on file    Inability: Not on file  . Transportation needs:    Medical: Not on file    Non-medical: Not on file  Tobacco Use  . Smoking status: Never Smoker  . Smokeless tobacco: Never Used  Substance and Sexual Activity  . Alcohol use: Yes    Alcohol/week: 0.0 standard drinks    Comment: social  . Drug use: No  . Sexual activity: Yes    Birth control/protection: Surgical    Comment: BTL-1st intercourse 53 yo-Fewer than 5 partners  Lifestyle  . Physical activity:    Days per week: Not on file    Minutes per session: Not on file  . Stress: Not on file  Relationships  . Social connections:    Talks on phone: Not on file    Gets together: Not on file    Attends religious service: Not on file    Active member of club or organization: Not on file    Attends meetings of clubs or organizations: Not on file    Relationship status: Not on file  Other Topics Concern  . Not on file  Social History Narrative  . Not on file   Family History  Problem Relation Age of Onset  . Diabetes Father        died in his 79's  . Heart disease Mother        died of MI at 25, possible enlarged heart  . Heart attack Sister        possible valve disease. triple bypass at age 90, died in her 31's   Past Surgical History:  Procedure Laterality Date  . BARTHOLIN CYST MARSUPIALIZATION    . CESAREAN SECTION     X3 with BTL  . DILATATION & CURETTAGE/HYSTEROSCOPY WITH TRUECLEAR N/A 04/12/2013   Procedure: DILATATION & CURETTAGE/HYSTEROSCOPY ;  Surgeon: Anastasio Auerbach, MD;  Location: [facility];  Service: Gynecology;  Laterality: N/A;  . TUBAL LIGATION        Vanessa Kick, MD 02/27/18 [id number]

## 2018-02-25 NOTE — ED Triage Notes (Signed)
Pt left shoulder pain pt states she used a heating pad and tylenol and nothing was working.

## 2018-02-28 ENCOUNTER — Ambulatory Visit (INDEPENDENT_AMBULATORY_CARE_PROVIDER_SITE_OTHER): Payer: BLUE CROSS/BLUE SHIELD | Admitting: Cardiology

## 2018-02-28 ENCOUNTER — Encounter: Payer: Self-pay | Admitting: Cardiology

## 2018-02-28 VITALS — BP 148/92 | HR 86 | Ht 67.0 in | Wt 228.4 lb

## 2018-02-28 DIAGNOSIS — H538 Other visual disturbances: Secondary | ICD-10-CM | POA: Diagnosis not present

## 2018-02-28 DIAGNOSIS — I421 Obstructive hypertrophic cardiomyopathy: Secondary | ICD-10-CM

## 2018-02-28 NOTE — Assessment & Plan Note (Signed)
Pt attributes this to Metoprolol- it is listed as a possible side effect (though I have never come across it)

## 2018-02-28 NOTE — Patient Instructions (Signed)
Medication Instructions:  Your physician recommends that you continue on your current medications as directed. Please refer to the Current Medication list given to you today. If you need a refill on your cardiac medications before your next appointment, please call your pharmacy.   Lab work: None  If you have labs (blood work) drawn today and your tests are completely normal, you will receive your results only by: Marland Kitchen MyChart Message (if you have MyChart) OR . A paper copy in the mail If you have any lab test that is abnormal or we need to change your treatment, we will call you to review the results.  Testing/Procedures: None  Follow-Up: At [facility], you and your health needs are our priority.  As part of our continuing mission to provide you with exceptional heart care, we have created designated Provider Care Teams.  These Care Teams include your primary Cardiologist (physician) and Advanced Practice Providers (APPs -  Physician Assistants and Nurse Practitioners) who all work together to provide you with the care you need, when you need it. You will need a follow up appointment in 12 months.  Please call our office 2 months in advance to schedule this appointment.  You may see Dr Skeet Latch or one of the following Advanced Practice Providers on your designated Care Team:   Kerin Ransom, PA-C Roby Lofts, [place] . Sande Rives, PA-C  Any Other Special Instructions Will Be Listed Below (If Applicable).

## 2018-02-28 NOTE — Assessment & Plan Note (Signed)
Mild by echo

## 2018-02-28 NOTE — Progress Notes (Signed)
02/28/2018 Judy [name]   02/19/1965  [id number]  Primary Physician Antonietta Jewel, MD Primary Cardiologist: dr Oval Linsey  HPI:  53 y/o AA female admitted to [facility] from Urgent Care 02/04/17 with chest pain and an abnormal EKG. Her Troponin were normal. Stress echo did not show significant ischemia but she was found to have a relatively mild inducible LVOT obstructive gradient. She was placed on Lopressor 12.5 mg BID. She takes HCTZ rarely PRN edema.  Since we saw he last year she has done well, no further chest pain, no syncope, no pre syncope.  Her initial B/P was elevated but repeat by me was 132/82.  When I asked bout her diet she told me she only uses the healthy pink salt.  She did mention that she believes her vision is a little blurred and she attributes it to Metoprolol (she read it online).     Current Outpatient Medications  Medication Sig Dispense Refill  . hydrochlorothiazide (HYDRODIURIL) 25 MG tablet Take 25 mg by mouth every morning.  11  . metoprolol tartrate (LOPRESSOR) 25 MG tablet Take 0.5 tablets (12.5 mg total) by mouth 2 (two) times daily. 90 tablet 3   No current facility-administered medications for this visit.     Allergies  Allergen Reactions  . Naproxen Sodium Hives    ANAPROX    Past Medical History:  Diagnosis Date  . HOCM (hypertrophic obstructive cardiomyopathy) ([place]) 02/04/2017    Social History   Socioeconomic History  . Marital status: Single    Spouse name: Not on file  . Number of children: Not on file  . Years of education: Not on file  . Highest education level: Not on file  Occupational History  . Not on file  Social Needs  . Financial resource strain: Not on file  . Food insecurity:    Worry: Not on file    Inability: Not on file  . Transportation needs:    Medical: Not on file    Non-medical: Not on file  Tobacco Use  . Smoking status: Never Smoker  . Smokeless tobacco: Never Used  Substance and Sexual Activity  . Alcohol  use: Yes    Alcohol/week: 0.0 standard drinks    Comment: social  . Drug use: No  . Sexual activity: Yes    Birth control/protection: Surgical    Comment: BTL-1st intercourse 53 yo-Fewer than 5 partners  Lifestyle  . Physical activity:    Days per week: Not on file    Minutes per session: Not on file  . Stress: Not on file  Relationships  . Social connections:    Talks on phone: Not on file    Gets together: Not on file    Attends religious service: Not on file    Active member of club or organization: Not on file    Attends meetings of clubs or organizations: Not on file    Relationship status: Not on file  . Intimate partner violence:    Fear of current or ex partner: Not on file    Emotionally abused: Not on file    Physically abused: Not on file    Forced sexual activity: Not on file  Other Topics Concern  . Not on file  Social History Narrative  . Not on file     Family History  Problem Relation Age of Onset  . Diabetes Father        died in his 74's  . Heart disease Mother  died of MI at 41, possible enlarged heart  . Heart attack Sister        possible valve disease. triple bypass at age 105, died in her 37's     Review of Systems: General: negative for chills, fever, night sweats or weight changes.  Cardiovascular: negative for chest pain, dyspnea on exertion, edema, orthopnea, palpitations, paroxysmal nocturnal dyspnea or shortness of breath Dermatological: negative for rash Respiratory: negative for cough or wheezing Urologic: negative for hematuria Abdominal: negative for nausea, vomiting, diarrhea, bright red blood per rectum, melena, or hematemesis Neurologic: negative for visual changes, syncope, or dizziness All other systems reviewed and are otherwise negative except as noted above.    Blood pressure (!) 148/92, pulse 86, height 5\' 7"  (1.702 m), weight 228 lb 6.4 oz (103.6 kg).  General appearance: alert, cooperative, no distress and mildly  obese Neck: no JVD Lungs: clear to auscultation bilaterally Heart: regular rate and rhythm Extremities: no edema Skin: Skin color, texture, turgor normal. No rashes or lesions Neurologic: Grossly normal  EKG NSR, LVH by volts   ASSESSMENT AND PLAN:   HOCM (hypertrophic obstructive cardiomyopathy) (HCC) Mild by echo  Blurred vision Pt attributes this to Metoprolol- it is listed as a possible side effect (though I have never come across it)   PLAN  I suggested she try and taper her metoprolol off to see if her blurred vision improved.  She declined "It's not that bad and I think it helps".  Same Rx, watch salt intake, f/u in one year.   Kerin Ransom PA-C  02/28/2018 4:51 PM

## 2018-03-08 ENCOUNTER — Other Ambulatory Visit: Payer: Self-pay | Admitting: Cardiology

## 2018-10-04 ENCOUNTER — Encounter: Payer: Self-pay | Admitting: Gynecology

## 2018-12-12 ENCOUNTER — Emergency Department (HOSPITAL_COMMUNITY): Payer: BC Managed Care – PPO

## 2018-12-12 ENCOUNTER — Other Ambulatory Visit: Payer: Self-pay

## 2018-12-12 ENCOUNTER — Encounter (HOSPITAL_COMMUNITY): Payer: Self-pay | Admitting: Emergency Medicine

## 2018-12-12 ENCOUNTER — Emergency Department (HOSPITAL_COMMUNITY)
Admission: EM | Admit: 2018-12-12 | Discharge: 2018-12-13 | Disposition: A | Discharge status: Home / Self Care | Payer: BC Managed Care – PPO | Attending: Emergency Medicine | Admitting: Emergency Medicine

## 2018-12-12 DIAGNOSIS — Z79899 Other long term (current) drug therapy: Secondary | ICD-10-CM | POA: Diagnosis not present

## 2018-12-12 DIAGNOSIS — R1032 Left lower quadrant pain: Secondary | ICD-10-CM | POA: Diagnosis present

## 2018-12-12 DIAGNOSIS — K5792 Diverticulitis of intestine, part unspecified, without perforation or abscess without bleeding: Secondary | ICD-10-CM | POA: Diagnosis not present

## 2018-12-12 DIAGNOSIS — R197 Diarrhea, unspecified: Secondary | ICD-10-CM

## 2018-12-12 LAB — CBC
HCT: 45.3 % (ref 36.0–46.0)
Hemoglobin: 15.2 g/dL — ABNORMAL HIGH (ref 12.0–15.0)
MCH: 31 pg (ref 26.0–34.0)
MCHC: 33.6 g/dL (ref 30.0–36.0)
MCV: 92.3 fL (ref 80.0–100.0)
Platelets: 335 K/uL (ref 150–400)
RBC: 4.91 MIL/uL (ref 3.87–5.11)
RDW: 14.1 % (ref 11.5–15.5)
WBC: 15.5 K/uL — ABNORMAL HIGH (ref 4.0–10.5)
nRBC: 0 % (ref 0.0–0.2)

## 2018-12-12 LAB — URINALYSIS, ROUTINE W REFLEX MICROSCOPIC
Bacteria, UA: NONE SEEN
Bilirubin Urine: NEGATIVE
Glucose, UA: NEGATIVE mg/dL
Hgb urine dipstick: NEGATIVE
Ketones, ur: 20 mg/dL — AB
Leukocytes,Ua: NEGATIVE
Nitrite: NEGATIVE
Protein, ur: 30 mg/dL — AB
Specific Gravity, Urine: 1.044 — ABNORMAL HIGH (ref 1.005–1.030)
pH: 6 (ref 5.0–8.0)

## 2018-12-12 LAB — COMPREHENSIVE METABOLIC PANEL WITH GFR
ALT: 18 U/L (ref 0–44)
AST: 22 U/L (ref 15–41)
Albumin: 4.1 g/dL (ref 3.5–5.0)
Alkaline Phosphatase: 99 U/L (ref 38–126)
Anion gap: 15 (ref 5–15)
BUN: 12 mg/dL (ref 6–20)
CO2: 23 mmol/L (ref 22–32)
Calcium: 9.5 mg/dL (ref 8.9–10.3)
Chloride: 98 mmol/L (ref 98–111)
Creatinine, Ser: 1.06 mg/dL — ABNORMAL HIGH (ref 0.44–1.00)
GFR calc Af Amer: >60 mL/min
GFR calc non Af Amer: 60 mL/min — ABNORMAL LOW
Glucose, Bld: 123 mg/dL — ABNORMAL HIGH (ref 70–99)
Potassium: 3.6 mmol/L (ref 3.5–5.1)
Sodium: 136 mmol/L (ref 135–145)
Total Bilirubin: 1.6 mg/dL — ABNORMAL HIGH (ref 0.3–1.2)
Total Protein: 8.8 g/dL — ABNORMAL HIGH (ref 6.5–8.1)

## 2018-12-12 LAB — LIPASE, BLOOD: Lipase: 21 U/L (ref 11–51)

## 2018-12-12 MED ORDER — OXYCODONE-ACETAMINOPHEN 5-325 MG PO TABS
2.0000 | ORAL_TABLET | Freq: Once | ORAL | Status: AC
  Administered 2018-12-12: 2 via ORAL
  Filled 2018-12-12: qty 2

## 2018-12-12 MED ORDER — CIPROFLOXACIN HCL 500 MG PO TABS
500.0000 mg | ORAL_TABLET | Freq: Once | ORAL | Status: AC
  Administered 2018-12-12: 500 mg via ORAL
  Filled 2018-12-12: qty 1

## 2018-12-12 MED ORDER — ONDANSETRON HCL 4 MG/2ML IJ SOLN
4.0000 mg | Freq: Once | INTRAMUSCULAR | Status: AC
  Administered 2018-12-12: 4 mg via INTRAVENOUS
  Filled 2018-12-12: qty 2

## 2018-12-12 MED ORDER — SODIUM CHLORIDE 0.9 % IV BOLUS
1000.0000 mL | Freq: Once | INTRAVENOUS | Status: AC
  Administered 2018-12-12: 1000 mL via INTRAVENOUS

## 2018-12-12 MED ORDER — METRONIDAZOLE 500 MG PO TABS
500.0000 mg | ORAL_TABLET | Freq: Once | ORAL | Status: AC
  Administered 2018-12-12: 500 mg via ORAL
  Filled 2018-12-12: qty 1

## 2018-12-12 MED ORDER — CIPROFLOXACIN HCL 500 MG PO TABS: 500.0000 mg | ORAL_TABLET | Freq: Two times a day (BID) | ORAL | 0 refills | Status: AC

## 2018-12-12 MED ORDER — OXYCODONE-ACETAMINOPHEN 5-325 MG PO TABS: 1.0000 | ORAL_TABLET | ORAL | 0 refills | Status: AC | PRN

## 2018-12-12 MED ORDER — IOHEXOL 300 MG/ML  SOLN
100.0000 mL | Freq: Once | INTRAMUSCULAR | Status: AC | PRN
  Administered 2018-12-12: 100 mL via INTRAVENOUS

## 2018-12-12 MED ORDER — METRONIDAZOLE 500 MG PO TABS: 500.0000 mg | ORAL_TABLET | Freq: Two times a day (BID) | ORAL | 0 refills | Status: DC

## 2018-12-12 MED ORDER — SODIUM CHLORIDE 0.9% FLUSH
3.0000 mL | Freq: Once | INTRAVENOUS | Status: AC
  Administered 2018-12-12: 3 mL via INTRAVENOUS

## 2018-12-12 NOTE — ED Notes (Signed)
Patient transported to CT 

## 2018-12-12 NOTE — ED Notes (Signed)
Pt did not respond when called for vitals check 

## 2018-12-12 NOTE — ED Provider Notes (Signed)
[facility] EMERGENCY DEPARTMENT Provider Note   CSN: [id number] Arrival date & time: 12/12/18  1634     History   Chief Complaint Chief Complaint  Patient presents with  . Abdominal Pain  . Emesis    HPI GIOVANNY ESCHRICH is a 53 y.o. female.     53 year old female with past medical history below who presents with abdominal pain and vomiting.  Yesterday morning, patient began having vomiting associated with lower abdominal pain.  Abdominal pain feels slightly better after vomiting but is still present at rest.  Pain was worse this afternoon when she was ambulating.  She reports associated nonbloody diarrhea.  She denies any urinary symptoms, cough, breathing problems, fevers, sick contacts, or recent travel.  The history is provided by the patient.  Abdominal Pain Associated symptoms: vomiting   Emesis Associated symptoms: abdominal pain     Past Medical History:  Diagnosis Date  . HOCM (hypertrophic obstructive cardiomyopathy) ([place]) 02/04/2017    Patient Active Problem List   Diagnosis Date Noted  . Blurred vision 02/28/2018  . Family history of congenital heart disease in sister 03/03/2017  . Sinusitis 02/04/2017  . Chest pain 02/04/2017  . Right arm numbness 02/04/2017  . AKI (acute kidney injury) ([place]) 02/04/2017  . Hypokalemia 02/04/2017  . HOCM (hypertrophic obstructive cardiomyopathy) ([place]) 02/04/2017    Past Surgical History:  Procedure Laterality Date  . BARTHOLIN CYST MARSUPIALIZATION    . CESAREAN SECTION     X3 with BTL  . DILATATION & CURETTAGE/HYSTEROSCOPY WITH TRUECLEAR N/A 04/12/2013   Procedure: DILATATION & CURETTAGE/HYSTEROSCOPY ;  Surgeon: Anastasio Auerbach, MD;  Location: [facility];  Service: Gynecology;  Laterality: N/A;  . TUBAL LIGATION       OB History    Gravida  5   Para  3   Term  3   Preterm      AB  2   Living  3     SAB  2   TAB      Ectopic      Multiple      Live Births                Home Medications    Prior to Admission medications   Medication Sig Start Date End Date Taking? Authorizing Provider  ciprofloxacin (CIPRO) 500 MG tablet Take 1 tablet (500 mg total) by mouth every 12 (twelve) hours for 7 days. 12/12/18 12/19/18  Solomiya Pascale, Wenda Overland, MD  hydrochlorothiazide (HYDRODIURIL) 25 MG tablet Take 25 mg by mouth every morning. 02/23/17   [provider]  metoprolol tartrate (LOPRESSOR) 25 MG tablet TAKE 1/2 TABLET BY MOUTH 2 TIMES DAILY 03/08/18   Kerin Ransom K, PA-C  metroNIDAZOLE (FLAGYL) 500 MG tablet Take 1 tablet (500 mg total) by mouth 2 (two) times daily. 12/12/18   Simon Aaberg, Wenda Overland, MD  oxyCODONE-acetaminophen (PERCOCET) 5-325 MG tablet Take 1 tablet by mouth every 4 (four) hours as needed for up to 5 days. 12/12/18 12/17/18  Earlee Herald, Wenda Overland, MD    Family History Family History  Problem Relation Age of Onset  . Diabetes Father        died in his 71's  . Heart disease Mother        died of MI at 76, possible enlarged heart  . Heart attack Sister        possible valve disease. triple bypass at age 60, died in her 26's    Social History Social History  Tobacco Use  . Smoking status: Never Smoker  . Smokeless tobacco: Never Used  Substance Use Topics  . Alcohol use: Yes    Alcohol/week: 0.0 standard drinks    Comment: social  . Drug use: No     Allergies   Naproxen sodium   Review of Systems Review of Systems  Gastrointestinal: Positive for abdominal pain and vomiting.   All other systems reviewed and are negative except that which was mentioned in HPI   Physical Exam Updated Vital Signs BP 140/79 (BP Location: Left Arm)   Pulse 96   Temp 99.4 F (37.4 C) (Oral)   Resp 16   SpO2 100%   Physical Exam Vitals signs and nursing note reviewed.  Constitutional:      General: She is not in acute distress.    Appearance: She is well-developed.  HENT:     Head: Normocephalic and atraumatic.  Eyes:      Conjunctiva/sclera: Conjunctivae normal.  Neck:     Musculoskeletal: Neck supple.  Cardiovascular:     Rate and Rhythm: Normal rate and regular rhythm.     Heart sounds: Normal heart sounds. No murmur.  Pulmonary:     Effort: Pulmonary effort is normal.     Breath sounds: Normal breath sounds.  Abdominal:     General: Bowel sounds are normal. There is no distension.     Palpations: Abdomen is soft.     Tenderness: There is abdominal tenderness in the right upper quadrant and left lower quadrant. There is no guarding or rebound.     Comments: Tenderness in LLQ>RLQ  Skin:    General: Skin is warm and dry.  Neurological:     Mental Status: She is alert and oriented to person, place, and time.     Comments: Fluent speech  Psychiatric:        Mood and Affect: Mood normal.        Judgment: Judgment normal.      ED Treatments / Results  Labs (all labs ordered are listed, but only abnormal results are displayed) Labs Reviewed  COMPREHENSIVE METABOLIC PANEL - Abnormal; Notable for the following components:      Result Value   Glucose, Bld 123 (*)    Creatinine, Ser 1.06 (*)    Total Protein 8.8 (*)    Total Bilirubin 1.6 (*)    GFR calc non Af Amer 60 (*)    All other components within normal limits  CBC - Abnormal; Notable for the following components:   WBC 15.5 (*)    Hemoglobin 15.2 (*)    All other components within normal limits  URINALYSIS, ROUTINE W REFLEX MICROSCOPIC - Abnormal; Notable for the following components:   APPearance HAZY (*)    Specific Gravity, Urine 1.044 (*)    Ketones, ur 20 (*)    Protein, ur 30 (*)    All other components within normal limits  LIPASE, BLOOD  I-STAT BETA HCG BLOOD, ED ([facility], [facility], [facility] ONLY)    EKG None  Radiology Ct Abdomen Pelvis W Contrast  Result Date: 12/12/2018 CLINICAL DATA:  Abdominal pain, vomiting diarrhea EXAM: CT ABDOMEN AND PELVIS WITH CONTRAST TECHNIQUE: Multidetector CT imaging of the abdomen and pelvis was  performed using the standard protocol following bolus administration of intravenous contrast. CONTRAST:  178mL OMNIPAQUE IOHEXOL 300 MG/ML  SOLN COMPARISON:  None. FINDINGS: Lower chest: The visualized heart size within normal limits. No pericardial fluid/thickening. No hiatal hernia. The visualized portions of the lungs are  clear. Hepatobiliary: The liver is normal in density without focal abnormality.The main portal vein is patent. No evidence of calcified gallstones, gallbladder wall thickening or biliary dilatation. Pancreas: Unremarkable. No pancreatic ductal dilatation or surrounding inflammatory changes. Spleen: Normal in size without focal abnormality. Adrenals/Urinary Tract: Both adrenal glands appear normal. The kidneys and collecting system appear normal without evidence of urinary tract calculus or hydronephrosis. Bladder is unremarkable. Stomach/Bowel: The stomach and small bowel are normal in appearance. There is a for 5 cm segment of sigmoid colon with diffuse wall thickening, diverticula and surrounding fat stranding changes. No loculated fluid collection or free air is seen. There is extensive colonic diverticulosis seen throughout. The appendix is normal in appearance. Vascular/Lymphatic: There are no enlarged mesenteric, retroperitoneal, or pelvic lymph nodes. Scattered mild aortic atherosclerotic calcifications are seen without aneurysmal dilatation. Reproductive: The uterus and adnexa are unremarkable. Other: No evidence of abdominal wall mass or hernia. Musculoskeletal: No acute or significant osseous findings. IMPRESSION: 1. Acute sigmoid colonic diverticulitis. No free air or pericolonic fluid collection. 2.  Aortic Atherosclerosis (ICD10-I70.0). Electronically Signed   By: Prudencio Pair M.D.   On: 12/12/2018 22:43    Procedures Procedures (including critical care time)  Medications Ordered in ED Medications  sodium chloride flush (NS) 0.9 % injection 3 mL (3 mLs Intravenous Given  12/12/18 2023)  ondansetron (ZOFRAN) injection 4 mg (4 mg Intravenous Given 12/12/18 2031)  sodium chloride 0.9 % bolus 1,000 mL (1,000 mLs Intravenous New Bag/Given 12/12/18 2031)  iohexol (OMNIPAQUE) 300 MG/ML solution 100 mL (100 mLs Intravenous Contrast Given 12/12/18 2228)  metroNIDAZOLE (FLAGYL) tablet 500 mg (500 mg Oral Given 12/12/18 2333)  ciprofloxacin (CIPRO) tablet 500 mg (500 mg Oral Given 12/12/18 2333)  oxyCODONE-acetaminophen (PERCOCET/ROXICET) 5-325 MG per tablet 2 tablet (2 tablets Oral Given 12/12/18 2337)     Initial Impression / Assessment and Plan / ED Course  I have reviewed the triage vital signs and the nursing notes.  Pertinent labs & imaging results that were available during my care of the patient were reviewed by me and considered in my medical decision making (see chart for details).       Vital signs reassuring, lower abdominal tenderness noted with no distention or peritonitis.  Differential includes gastroenteritis, diverticulitis, or appendicitis.  Gave fluid bolus and Zofran.  Labs show normal LFTs and lipase.  WBC elevated at 15.5.  Obtain CT of abdomen/pelvis which showed acute uncomplicated sigmoid diverticulitis.  I discussed findings with patient and recommended course of antibiotics.  Gave first dose of Flagyl and ciprofloxacin in the ED.  Patient tolerated medications and fluids with no further episodes of vomiting.  Discussed supportive measures at home, PCP follow-up for reassessment.  I have extensively reviewed return precautions with her and she voiced understanding.  SHATASIA CEREZO was evaluated in Emergency Department on 12/12/2018 for the symptoms described in the history of present illness. She was evaluated in the context of the global COVID-19 pandemic, which necessitated consideration that the patient might be at risk for infection with the SARS-CoV-2 virus that causes COVID-19. Institutional protocols and algorithms that pertain to the evaluation of  patients at risk for COVID-19 are in a state of rapid change based on information released by regulatory bodies including the CDC and federal and state organizations. These policies and algorithms were followed during the patient's care in the ED.   Final Clinical Impressions(s) / ED Diagnoses   Final diagnoses:  Diverticulitis  Nausea vomiting and diarrhea  ED Discharge Orders         Ordered    ciprofloxacin (CIPRO) 500 MG tablet  Every 12 hours     12/12/18 2330    metroNIDAZOLE (FLAGYL) 500 MG tablet  2 times daily     12/12/18 2330    oxyCODONE-acetaminophen (PERCOCET) 5-325 MG tablet  Every 4 hours PRN     12/12/18 2330           Juanita Streight, Wenda Overland, MD 12/12/18 2340

## 2018-12-12 NOTE — ED Triage Notes (Signed)
Pt arrives to ED from home with complaints of on going LLQ and RLQ abdominal pain since Sunday night. Patient states that she cannot keep anything down and continues to vomit and have nausea.

## 2018-12-13 NOTE — ED Notes (Signed)
Patient verbalizes understanding of discharge instructions. Opportunity for questioning and answers were provided. Armband removed by staff, pt discharged from ED.  

## 2019-03-06 ENCOUNTER — Telehealth: Payer: Self-pay | Admitting: *Deleted

## 2019-03-06 NOTE — Telephone Encounter (Signed)
A message was left, re: her follow up visit. 

## 2019-05-25 ENCOUNTER — Encounter: Payer: Self-pay | Admitting: Cardiovascular Disease

## 2019-06-14 ENCOUNTER — Ambulatory Visit (INDEPENDENT_AMBULATORY_CARE_PROVIDER_SITE_OTHER): Payer: BC Managed Care – PPO | Admitting: Cardiology

## 2019-06-14 ENCOUNTER — Other Ambulatory Visit: Payer: Self-pay

## 2019-06-14 ENCOUNTER — Encounter: Payer: Self-pay | Admitting: Cardiology

## 2019-06-14 VITALS — BP 148/96 | HR 61 | Ht 67.0 in | Wt 241.6 lb

## 2019-06-14 DIAGNOSIS — I10 Essential (primary) hypertension: Secondary | ICD-10-CM | POA: Insufficient documentation

## 2019-06-14 DIAGNOSIS — R9431 Abnormal electrocardiogram [ECG] [EKG]: Secondary | ICD-10-CM | POA: Diagnosis not present

## 2019-06-14 DIAGNOSIS — I421 Obstructive hypertrophic cardiomyopathy: Secondary | ICD-10-CM | POA: Diagnosis not present

## 2019-06-14 HISTORY — DX: Essential (primary) hypertension: I10

## 2019-06-14 MED ORDER — HYDROCHLOROTHIAZIDE 25 MG PO TABS: 25.0000 mg | ORAL_TABLET | Freq: Every morning | ORAL | 6 refills | Status: DC

## 2019-06-14 MED ORDER — METOPROLOL TARTRATE 25 MG PO TABS: ORAL_TABLET | ORAL | 4 refills | Status: DC

## 2019-06-14 NOTE — Assessment & Plan Note (Signed)
Out of meds for the past month

## 2019-06-14 NOTE — Assessment & Plan Note (Signed)
She has had anterior TWI on prior EKG but the inferior TWI today is new.

## 2019-06-14 NOTE — Progress Notes (Signed)
Cardiology Office Note:    Date:  06/14/2019   ID:  Arlester Marker, DOB 1965-07-23, MRN [id number]  PCP:  Antonietta Jewel, MD  Cardiologist:  Dr Oval Linsey Electrophysiologist:  None   Referring MD: Antonietta Jewel, MD   CC:  Out of her medications for a month- DOE  History of Present Illness:    Judy [name] is a 54 y.o. female with a hx of prior evaluation in 2019 for an abnormal EKG and chest pain.  Pt was referred from an Urgent Care 02/04/17 with chest pain and an abnormal EKG. Her Troponin were normal. Stress echo did not show significant ischemia but she was found to have arelatively mild inducible LVOT obstructive gradient with grade 2 DD.She was placed on Lopressor 12.5 mg BID. She takes HCTZ rarely PRN edema. She was seen in f/u 02/28/2018 and was doing well.  She presents today for follow-up.  She has been out of her medications for at least a month.  She has noted some shortness of breath with exertion since she has been out of her medications.  She denies any orthopnea or significant chest pain.  Her EKG today shows T wave inversion in inferior septal leads.  In the past she has had transient anterior T wave inversion, the inferior T wave inversion appears to be new.  She denies any syncope or near syncope.  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  Past Medical History:  Diagnosis Date  . HOCM (hypertrophic obstructive cardiomyopathy) ([place]) 02/04/2017    Past Surgical History:  Procedure Laterality Date  . BARTHOLIN CYST MARSUPIALIZATION    . CESAREAN SECTION     X3 with BTL  . DILATATION & CURETTAGE/HYSTEROSCOPY WITH TRUECLEAR N/A 04/12/2013   Procedure: DILATATION & CURETTAGE/HYSTEROSCOPY ;  Surgeon: Anastasio Auerbach, MD;  Location: [facility];  Service: Gynecology;  Laterality: N/A;  . TUBAL LIGATION      Current Medications: Current Meds  Medication Sig  . hydrochlorothiazide (HYDRODIURIL) 25 MG  tablet Take 1 tablet (25 mg total) by mouth every morning.  . metoprolol tartrate (LOPRESSOR) 25 MG tablet TAKE 1/2 TABLET BY MOUTH 2 TIMES DAILY  . [DISCONTINUED] hydrochlorothiazide (HYDRODIURIL) 25 MG tablet Take 25 mg by mouth every morning.  . [DISCONTINUED] metoprolol tartrate (LOPRESSOR) 25 MG tablet TAKE 1/2 TABLET BY MOUTH 2 TIMES DAILY  . [DISCONTINUED] metroNIDAZOLE (FLAGYL) 500 MG tablet Take 1 tablet (500 mg total) by mouth 2 (two) times daily.     Allergies:   Naproxen sodium   Social History   Socioeconomic History  . Marital status: Single    Spouse name: Not on file  . Number of children: Not on file  . Years of education: Not on file  . Highest education level: Not on file  Occupational History  . Not on file  Tobacco Use  . Smoking status: Never Smoker  . Smokeless tobacco: Never Used  Substance and Sexual Activity  . Alcohol use: Yes    Alcohol/week: 0.0 standard drinks    Comment: social  . Drug use: No  . Sexual activity: Yes    Birth control/protection: Surgical    Comment: BTL-1st intercourse 54 yo-Fewer than 5 partners  Other Topics Concern  . Not on file  Social History Narrative  . Not on file   Social Determinants of Health   Financial Resource Strain:   . Difficulty of Paying Living Expenses:  Food Insecurity:   . Worried About Charity fundraiser in the Last Year:   . Arboriculturist in the Last Year:   Transportation Needs:   . Film/video editor (Medical):   Marland Kitchen Lack of Transportation (Non-Medical):   Physical Activity:   . Days of Exercise per Week:   . Minutes of Exercise per Session:   Stress:   . Feeling of Stress :   Social Connections:   . Frequency of Communication with Friends and Family:   . Frequency of Social Gatherings with Friends and Family:   . Attends Religious Services:   . Active Member of Clubs or Organizations:   . Attends Archivist Meetings:   Marland Kitchen Marital Status:      Family History: The  patient's family history includes Diabetes in her father; Heart attack in her sister; Heart disease in her mother.  ROS:   Please see the history of present illness.   Tingling Rt hand and forearm   All other systems reviewed and are negative.  EKGs/Labs/Other Studies Reviewed:    The following studies were reviewed today: Echo 02/04/2017-  Study Conclusions   - Left ventricle: The cavity size was normal. Moderately increased  relative wall thickness. There was moderate asymmetric  hypertrophy of the septum. The appearance was consistent with  hypertrophic cardiomyopathy. Systolic function was vigorous. The  estimated ejection fraction was in the range of 65% to 70%.  Dynamic obstruction cannot be excluded. Wall motion was normal;  there were no regional wall motion abnormalities. Features are  consistent with a pseudonormal left ventricular filling pattern,  with concomitant abnormal relaxation and increased filling  pressure (grade 2 diastolic dysfunction).  - Mitral valve: There was moderate systolic anterior motion of the  anterior leaflet. There was moderate to severe regurgitation  directed eccentrically and posteriorly.  - Left atrium: The atrium was mildly dilated.   EKG:  EKG is ordered today.  The ekg ordered today demonstrates NSR- HR 61, TWI 2.3.F-V1-V2. QTC 485  Recent Labs: 12/12/2018: ALT 18; BUN 12; Creatinine, Ser 1.06; Hemoglobin 15.2; Platelets 335; Potassium 3.6; Sodium 136  Recent Lipid Panel    Component Value Date/Time   CHOL 203 (H) 08/03/2017 1720   TRIG 74 08/03/2017 1720   HDL 57 08/03/2017 1720   CHOLHDL 3.6 08/03/2017 1720   VLDL 26 02/04/2017 0301   LDLCALC 129 (H) 08/03/2017 1720    Physical Exam:    VS:  BP (!) 148/96   Pulse 61   Ht 5\' 7"  (1.702 m)   Wt 241 lb 9.6 oz (109.6 kg)   SpO2 94%   BMI 37.84 kg/m     Wt Readings from Last 3 Encounters:  06/14/19 241 lb 9.6 oz (109.6 kg)  02/28/18 228 lb 6.4 oz (103.6  kg)  02/25/18 224 lb (101.6 kg)     GEN: Overweight female, well developed in no acute distress HEENT: Normal NECK: No JVD; No carotid bruits CARDIAC: RRR, soft systolic murmur AOV- no change valsalva, no rubs, gallops RESPIRATORY:  Clear to auscultation without rales, wheezing or rhonchi  ABDOMEN: Soft, non-tender, non-distended MUSCULOSKELETAL:  No edema; No deformity  SKIN: Warm and dry NEUROLOGIC:  Alert and oriented x 3 PSYCHIATRIC:  Normal affect   ASSESSMENT:    HOCM (hypertrophic obstructive cardiomyopathy) ([facility]) Mild by echo 2019  Uncontrolled hypertension Out of meds for the past month  Nonspecific abnormal electrocardiogram (ECG) (EKG) She has had anterior TWI on prior EKG but  the inferior TWI today is new.  PLAN:    Resume HCTZ and Metoprolol. Check echo. Avoid salt. F/U with Dr Oval Linsey in 4-6 weeks.    Medication Adjustments/Labs and Tests Ordered: Current medicines are reviewed at length with the patient today.  Concerns regarding medicines are outlined above.  Orders Placed This Encounter  Procedures  . EKG 12-Lead  . ECHOCARDIOGRAM COMPLETE   Meds ordered this encounter  Medications  . hydrochlorothiazide (HYDRODIURIL) 25 MG tablet    Sig: Take 1 tablet (25 mg total) by mouth every morning.    Dispense:  30 tablet    Refill:  6  . metoprolol tartrate (LOPRESSOR) 25 MG tablet    Sig: TAKE 1/2 TABLET BY MOUTH 2 TIMES DAILY    Dispense:  60 tablet    Refill:  4    Patient Instructions  Medication Instructions:  Your physician recommends that you continue on your current medications as directed. Please refer to the Current Medication list given to you today.  *If you need a refill on your cardiac medications before your next appointment, please call your pharmacy*   Lab Work: None ordered   If you have labs (blood work) drawn today and your tests are completely normal, you will receive your results only by: Marland Kitchen MyChart Message (if you have  MyChart) OR . A paper copy in the mail If you have any lab test that is abnormal or we need to change your treatment, we will call you to review the results.   Testing/Procedures: Your physician has requested that you have an echocardiogram. Echocardiography is a painless test that uses sound waves to create images of your heart. It provides your doctor with information about the size and shape of your heart and how well your heart's chambers and valves are working. This procedure takes approximately one hour. There are no restrictions for this procedure.     Follow-Up: Follow up in 4-6 weeks with Skeet Latch, MD   Other Instructions None      Signed, Kerin Ransom, PA-C  06/14/2019 8:57 AM    [place]

## 2019-06-14 NOTE — Patient Instructions (Signed)
Medication Instructions:  Your physician recommends that you continue on your current medications as directed. Please refer to the Current Medication list given to you today.  *If you need a refill on your cardiac medications before your next appointment, please call your pharmacy*   Lab Work: None ordered   If you have labs (blood work) drawn today and your tests are completely normal, you will receive your results only by: Marland Kitchen MyChart Message (if you have MyChart) OR . A paper copy in the mail If you have any lab test that is abnormal or we need to change your treatment, we will call you to review the results.   Testing/Procedures: Your physician has requested that you have an echocardiogram. Echocardiography is a painless test that uses sound waves to create images of your heart. It provides your doctor with information about the size and shape of your heart and how well your heart's chambers and valves are working. This procedure takes approximately one hour. There are no restrictions for this procedure.     Follow-Up: Follow up in 4-6 weeks with Skeet Latch, MD   Other Instructions None

## 2019-06-14 NOTE — Assessment & Plan Note (Signed)
Mild by echo 2019

## 2019-07-03 ENCOUNTER — Ambulatory Visit (HOSPITAL_COMMUNITY): Payer: BC Managed Care – PPO | Attending: Cardiology

## 2019-07-03 ENCOUNTER — Other Ambulatory Visit: Payer: Self-pay

## 2019-07-03 DIAGNOSIS — I10 Essential (primary) hypertension: Secondary | ICD-10-CM | POA: Insufficient documentation

## 2019-07-05 ENCOUNTER — Telehealth: Payer: Self-pay

## 2019-07-05 NOTE — Telephone Encounter (Addendum)
Left a voice message for the patient to give our office a call back for her echo results.  ----- Message from Lendon Colonel, NP sent at 07/03/2019 12:53 PM EDT ----- Managing Judy Ransom, PA box.   Echo reviewed . No evidence of outflow tract obstruction. Normal heart pumping function with minimal heart relaxation abnormality. Mild mitral valve regurgitation.  No significant concerns here. Continue current medication regimen.

## 2019-07-24 ENCOUNTER — Encounter: Payer: Self-pay | Admitting: *Deleted

## 2019-08-21 ENCOUNTER — Other Ambulatory Visit: Payer: Self-pay

## 2019-08-21 ENCOUNTER — Encounter: Payer: Self-pay | Admitting: Cardiovascular Disease

## 2019-08-21 ENCOUNTER — Ambulatory Visit (INDEPENDENT_AMBULATORY_CARE_PROVIDER_SITE_OTHER): Payer: BC Managed Care – PPO | Admitting: Cardiovascular Disease

## 2019-08-21 VITALS — BP 136/80 | HR 112 | Ht 67.0 in | Wt 238.6 lb

## 2019-08-21 DIAGNOSIS — E669 Obesity, unspecified: Secondary | ICD-10-CM | POA: Diagnosis not present

## 2019-08-21 DIAGNOSIS — Z01812 Encounter for preprocedural laboratory examination: Secondary | ICD-10-CM | POA: Diagnosis not present

## 2019-08-21 DIAGNOSIS — I421 Obstructive hypertrophic cardiomyopathy: Secondary | ICD-10-CM | POA: Diagnosis not present

## 2019-08-21 DIAGNOSIS — I10 Essential (primary) hypertension: Secondary | ICD-10-CM | POA: Diagnosis not present

## 2019-08-21 HISTORY — DX: Obesity, unspecified: E66.9

## 2019-08-21 MED ORDER — HYDROCHLOROTHIAZIDE 12.5 MG PO TABS: 12.5000 mg | ORAL_TABLET | Freq: Every day | ORAL | 1 refills | Status: DC

## 2019-08-21 NOTE — Progress Notes (Signed)
Cardiology Office Note   Date:  08/21/2019   ID:  Judy [name], DOB [birth year], MRN [id number]  PCP:  Antonietta Jewel, MD  Cardiologist:   Skeet Latch, MD   No chief complaint on file.     History of Present Illness: Judy [name] is a 54 y.o. female with HCM and hypertension who presents for follow up.  She was initially seen in the hospital 01/2017 with chest pain.  Cardiac enzymes were negative.  Her EKG was concerning for hypertrophic cardiomyopathy or LVH with repolarization abnormalities.  Her chest pain was thought to be atypical.  Her exam was consistent with hokum.  During that hospitalization she had a short run of NSVT.  It was recommended that she get a cardiac MRI but this did not occur prior to discharge.  She was started on metoprolol.  She was last seen by Kerin Ransom, PA-C on 06/2019.  At the time she has been out of her medications for over a month and felt poorly.  A repeat echocardiogram revealed LVEF 60 to 65% with mild asymmetric septal hypertrophy.  There was mild to moderate mitral regurgitation and grade 1 diastolic dysfunction.  Since restarting metoprolol she has been feeling better.  She has no chest pain and her breathing is stable.  She works third shift and struggles with both her diet and exercise.  She mostly cooks at home but often does not eat because she is too tired to stay up.  She has not had any lower extremity edema, orthopnea, or PND.  Yesterday she had an episode of her heart racing for a few minutes while watching TV.  She was not lightheaded or dizzy.  She does not recall having any episodes like this in the past.  She has some numbness and tingling in her right arm that occurs intermittently she notices it when laying in bed and with certain positions.  It does not occur with overuse.  She has three daughters.  They all had echocardiograms when running track.  There is no report of HCM.  No family history of SCD.   Past Medical History:  Diagnosis  Date  . HOCM (hypertrophic obstructive cardiomyopathy) ([place]) 02/04/2017  . Obesity (BMI 30-39.9) 08/21/2019    Past Surgical History:  Procedure Laterality Date  . BARTHOLIN CYST MARSUPIALIZATION    . CESAREAN SECTION     X3 with BTL  . DILATATION & CURETTAGE/HYSTEROSCOPY WITH TRUECLEAR N/A 04/12/2013   Procedure: DILATATION & CURETTAGE/HYSTEROSCOPY ;  Surgeon: Anastasio Auerbach, MD;  Location: [facility];  Service: Gynecology;  Laterality: N/A;  . TUBAL LIGATION       Current Outpatient Medications  Medication Sig Dispense Refill  . hydrochlorothiazide (HYDRODIURIL) 12.5 MG tablet Take 1 tablet (12.5 mg total) by mouth daily. 90 tablet 1  . metoprolol tartrate (LOPRESSOR) 25 MG tablet TAKE 1/2 TABLET BY MOUTH 2 TIMES DAILY 60 tablet 4   No current facility-administered medications for this visit.    Allergies:   Naproxen sodium    Social History:  The patient  reports that she has never smoked. She has never used smokeless tobacco. She reports current alcohol use. She reports that she does not use drugs.   Family History:  The patient's family history includes Diabetes in her father; Heart attack in her sister; Heart disease in her mother.    ROS:  Please see the history of present illness.   Otherwise, review of systems are positive for none.  All other systems are reviewed and negative.    PHYSICAL EXAM: VS:  BP 136/80   Pulse (!) 112   Ht 5\' 7"  (1.702 m)   Wt 238 lb 9.6 oz (108.2 kg)   BMI 37.37 kg/m  , BMI Body mass index is 37.37 kg/m. GENERAL:  Well appearing HEENT:  Pupils equal round and reactive, fundi not visualized, oral mucosa unremarkable NECK:  No jugular venous distention, waveform within normal limits, carotid upstroke brisk and symmetric, no bruits, no thyromegaly LYMPHATICS:  No cervical adenopathy LUNGS:  Clear to auscultation bilaterally HEART:  RRR.  PMI not displaced or sustained,S1 and S2 within normal limits, no S3, no S4, no clicks, no rubs, II/IVI  systolic murmur at the LUSB.  Augments with Valsalva and hand grip.  ABD:  Flat, positive bowel sounds normal in frequency in pitch, no bruits, no rebound, no guarding, no midline pulsatile mass, no hepatomegaly, no splenomegaly EXT:  2 plus pulses throughout, no edema, no cyanosis no clubbing SKIN:  No rashes no nodules NEURO:  Cranial nerves II through XII grossly intact, motor grossly intact throughout PSYCH:  Cognitively intact, oriented to person place and time   EKG:  EKG is ordered today. The ekg ordered today demonstrates sinus rhythm.  Rate 75 bpm.  Nonspecific T wave abnormalities.  Echo 06/2019: IMPRESSIONS    1. Normal LV function; grade 1 diastolic dysfunction; mild asymmetric  septal hypertrophy; no LVOT gradient at rest or with valsalva; mild to  moderate MR; mild LAE. Findings possibly consistent with HCM; suggest  cardiac MRI to further assess.  2. Left ventricular ejection fraction, by estimation, is 60 to 65%. The  left ventricle has normal function. The left ventricle has no regional  wall motion abnormalities. Left ventricular diastolic parameters are  consistent with Grade I diastolic  dysfunction (impaired relaxation).  3. Right ventricular systolic function is normal. The right ventricular  size is normal. There is normal pulmonary artery systolic pressure.  4. Left atrial size was mildly dilated.  5. The mitral valve is normal in structure. Mild to moderate mitral valve  regurgitation. No evidence of mitral stenosis.  6. The aortic valve is tricuspid. Aortic valve regurgitation is not  visualized. Mild aortic valve sclerosis is present, with no evidence of  aortic valve stenosis.  7. The inferior vena cava is normal in size with greater than 50%  respiratory variability, suggesting right atrial pressure of 3 mmHg.    Recent Labs: 12/12/2018: ALT 18; BUN 12; Creatinine, Ser 1.06; Hemoglobin 15.2; Platelets 335; Potassium 3.6; Sodium 136    Lipid  Panel    Component Value Date/Time   CHOL 203 (H) 08/03/2017 1720   TRIG 74 08/03/2017 1720   HDL 57 08/03/2017 1720   CHOLHDL 3.6 08/03/2017 1720   VLDL 26 02/04/2017 0301   LDLCALC 129 (H) 08/03/2017 1720      Wt Readings from Last 3 Encounters:  08/21/19 238 lb 9.6 oz (108.2 kg)  06/14/19 241 lb 9.6 oz (109.6 kg)  02/28/18 228 lb 6.4 oz (103.6 kg)      ASSESSMENT AND PLAN:  # HCM: Moderate septal hypertrophy.  There is no evidence of outflow track obstruction or Sam on her last echo.  This was previously noted.  On exam today she does have an outflow tract murmur that augments with Valsalva and handgrip.  She is feeling better on metoprolol.  She is not very symptomatic.  We will get a cardiac MRI to assess for scar.  Given that she has very mild septal hypertrophy is unlikely she will need an ICD.  There is no family history of sudden cardiac death.  She did have some NSVT in the hospital several years ago.  Consider ambulatory monitor and ETT.  # Obesity:  Recommend finding time for exercise 150 min weekly.  Small high protein meals rather than fasting.   # Essential hypertension:   Blood pressure is consistently elevated.  Plan to switch hydrochlorothiazide to 12.5 mg daily instead of 25 mg as needed.  Continue metoprolol.   Current medicines are reviewed at length with the patient today.  The patient does not have concerns regarding medicines.  The following changes have been made:  Switch HCTZ to daily  Labs/ tests ordered today include:   Orders Placed This Encounter  Procedures  . MR Card Morphology Wo/W Cm  . Basic metabolic panel  . EKG 12-Lead     Disposition:   FU with Willard Farquharson C. Oval Linsey, MD, [facility] in 2 months.     Signed, Alaiah Lundy C. Oval Linsey, MD, [facility]  08/21/2019 12:52 PM     [facility]

## 2019-08-21 NOTE — Patient Instructions (Signed)
Medication Instructions:  START TAKING YOUR HYDROCHLOROTHIAZIDE 12.5 MG DAILY   *If you need a refill on your cardiac medications before your next appointment, please call your pharmacy*  Lab Work: BMET 1 WEEK PRIOR TO CARDIAC MRI  If you have labs (blood work) drawn today and your tests are completely normal, you will receive your results only by:  [facility] (if you have MyChart) OR  A paper copy in the mail If you have any lab test that is abnormal or we need to change your treatment, we will call you to review the results.  Testing/Procedures: Your physician has requested that you have a cardiac MRI. Cardiac MRI uses a computer to create images of your heart as its beating, producing both still and moving pictures of your heart and major blood vessels. For further information please visit [email or web address] Please follow the instruction sheet given to you today for more information.  Follow-Up: At [facility], you and your health needs are our priority.  As part of our continuing mission to provide you with exceptional heart care, we have created designated Provider Care Teams.  These Care Teams include your primary Cardiologist (physician) and Advanced Practice Providers (APPs -  Physician Assistants and Nurse Practitioners) who all work together to provide you with the care you need, when you need it.  We recommend signing up for the patient portal called "MyChart".  Sign up information is provided on this After Visit Summary.  MyChart is used to connect with patients for Virtual Visits (Telemedicine).  Patients are able to view lab/test results, encounter notes, upcoming appointments, etc.  Non-urgent messages can be sent to your provider as well.   To learn more about what you can do with MyChart, go to [email or web address].    Your next appointment:   2 month(s)  The format for your next appointment:   In Person  Provider:   You may see DR [facility] or one of the  following Advanced Practice Providers on your designated Care Team:    Kerin Ransom, PA-C  [place], [place]  Coletta Memos, [place]

## 2019-08-27 ENCOUNTER — Telehealth: Payer: Self-pay | Admitting: Cardiovascular Disease

## 2019-08-27 NOTE — Telephone Encounter (Signed)
Left message for patient to call and discuss preferred weekdays/times for the Cardiac MRI ordered by Dr. Oval Linsey an

## 2019-08-28 NOTE — Telephone Encounter (Signed)
Left message for patient to call and discuss scheduling the Cardiac MRI ordered by Dr. [place] 

## 2019-08-29 NOTE — Telephone Encounter (Signed)
Left message for patient to call and discuss scheduling the Cardiac MRI ordered by Dr. [place] 

## 2019-08-31 ENCOUNTER — Encounter: Payer: Self-pay | Admitting: Cardiovascular Disease

## 2019-08-31 NOTE — Telephone Encounter (Signed)
Left message regarding appointment for Cardiac MRI scheduled Monday 10/08/19 at 8:00 am at [facility]---arrival time is 7:30 am 1st floor admissions office----will mail information to patient and ask she call with questions or concerns

## 2019-10-04 ENCOUNTER — Telehealth (HOSPITAL_COMMUNITY): Payer: Self-pay | Admitting: Emergency Medicine

## 2019-10-04 NOTE — Telephone Encounter (Signed)
Attempted to call patient regarding upcoming cardiac MR appointment. Left message on voicemail with name and callback number Opie Fanton RN Navigator Cardiac Imaging [facility] [phone] Office [phone] Cell  

## 2019-10-08 ENCOUNTER — Ambulatory Visit (HOSPITAL_COMMUNITY)
Admission: RE | Admit: 2019-10-08 | Discharge: 2019-10-08 | Disposition: A | Discharge status: Home / Self Care | Payer: BC Managed Care – PPO | Source: Ambulatory Visit | Attending: Cardiovascular Disease | Admitting: Cardiovascular Disease

## 2019-10-08 ENCOUNTER — Other Ambulatory Visit: Payer: Self-pay

## 2019-10-08 DIAGNOSIS — I421 Obstructive hypertrophic cardiomyopathy: Secondary | ICD-10-CM | POA: Insufficient documentation

## 2019-10-08 MED ORDER — GADOBUTROL 1 MMOL/ML IV SOLN
10.0000 mL | Freq: Once | INTRAVENOUS | Status: AC | PRN
  Administered 2019-10-08: 10 mL via INTRAVENOUS

## 2019-10-19 ENCOUNTER — Ambulatory Visit: Payer: BC Managed Care – PPO | Admitting: Cardiovascular Disease

## 2019-12-18 DIAGNOSIS — E876 Hypokalemia: Secondary | ICD-10-CM | POA: Diagnosis not present

## 2019-12-18 DIAGNOSIS — Z0001 Encounter for general adult medical examination with abnormal findings: Secondary | ICD-10-CM | POA: Diagnosis not present

## 2019-12-18 DIAGNOSIS — R1013 Epigastric pain: Secondary | ICD-10-CM | POA: Diagnosis not present

## 2019-12-18 DIAGNOSIS — I10 Essential (primary) hypertension: Secondary | ICD-10-CM | POA: Diagnosis not present

## 2019-12-18 DIAGNOSIS — E785 Hyperlipidemia, unspecified: Secondary | ICD-10-CM | POA: Diagnosis not present

## 2019-12-20 ENCOUNTER — Other Ambulatory Visit: Payer: Self-pay | Admitting: Internal Medicine

## 2019-12-20 DIAGNOSIS — Z1231 Encounter for screening mammogram for malignant neoplasm of breast: Secondary | ICD-10-CM

## 2020-01-02 ENCOUNTER — Ambulatory Visit
Admission: RE | Admit: 2020-01-02 | Discharge: 2020-01-02 | Disposition: A | Discharge status: Home / Self Care | Payer: BC Managed Care – PPO | Source: Ambulatory Visit | Attending: Internal Medicine | Admitting: Internal Medicine

## 2020-01-02 ENCOUNTER — Other Ambulatory Visit: Payer: Self-pay

## 2020-01-02 DIAGNOSIS — Z1231 Encounter for screening mammogram for malignant neoplasm of breast: Secondary | ICD-10-CM | POA: Diagnosis not present

## 2020-01-22 ENCOUNTER — Other Ambulatory Visit: Payer: Self-pay | Admitting: Cardiovascular Disease

## 2020-01-28 ENCOUNTER — Encounter: Payer: Self-pay | Admitting: *Deleted

## 2020-02-21 DIAGNOSIS — R5383 Other fatigue: Secondary | ICD-10-CM | POA: Diagnosis not present

## 2020-02-21 DIAGNOSIS — M2550 Pain in unspecified joint: Secondary | ICD-10-CM | POA: Diagnosis not present

## 2020-02-21 DIAGNOSIS — I10 Essential (primary) hypertension: Secondary | ICD-10-CM | POA: Diagnosis not present

## 2020-02-21 DIAGNOSIS — E876 Hypokalemia: Secondary | ICD-10-CM | POA: Diagnosis not present

## 2020-03-12 ENCOUNTER — Encounter: Payer: Self-pay | Admitting: Internal Medicine

## 2020-03-22 ENCOUNTER — Other Ambulatory Visit: Payer: Self-pay | Admitting: Cardiology

## 2020-03-22 ENCOUNTER — Other Ambulatory Visit: Payer: Self-pay | Admitting: Cardiovascular Disease

## 2020-03-25 DIAGNOSIS — I10 Essential (primary) hypertension: Secondary | ICD-10-CM | POA: Diagnosis not present

## 2020-03-25 DIAGNOSIS — E669 Obesity, unspecified: Secondary | ICD-10-CM | POA: Diagnosis not present

## 2020-03-25 DIAGNOSIS — R6889 Other general symptoms and signs: Secondary | ICD-10-CM | POA: Diagnosis not present

## 2020-03-25 DIAGNOSIS — R5383 Other fatigue: Secondary | ICD-10-CM | POA: Diagnosis not present

## 2020-03-26 ENCOUNTER — Ambulatory Visit (AMBULATORY_SURGERY_CENTER): Payer: Self-pay | Admitting: *Deleted

## 2020-03-26 ENCOUNTER — Other Ambulatory Visit: Payer: Self-pay

## 2020-03-26 ENCOUNTER — Encounter: Payer: Self-pay | Admitting: *Deleted

## 2020-03-26 VITALS — Ht 67.0 in | Wt 245.0 lb

## 2020-03-26 DIAGNOSIS — Z1211 Encounter for screening for malignant neoplasm of colon: Secondary | ICD-10-CM

## 2020-03-26 MED ORDER — PLENVU 140 G PO SOLR: 1.0000 | Freq: Once | ORAL | 0 refills | Status: AC

## 2020-03-26 NOTE — Telephone Encounter (Signed)
No show for pre visit and unable to reach by phone. Message left to call and reschedule pre-visit to avoid cancellation of up-coming colonoscopy.

## 2020-03-26 NOTE — Progress Notes (Signed)

## 2020-04-11 ENCOUNTER — Encounter: Payer: BC Managed Care – PPO | Admitting: Internal Medicine

## 2020-04-22 ENCOUNTER — Encounter: Payer: Self-pay | Admitting: *Deleted

## 2020-05-14 ENCOUNTER — Encounter: Payer: Self-pay | Admitting: Internal Medicine

## 2020-05-16 ENCOUNTER — Ambulatory Visit (AMBULATORY_SURGERY_CENTER): Payer: BC Managed Care – PPO | Admitting: Internal Medicine

## 2020-05-16 ENCOUNTER — Encounter: Payer: Self-pay | Admitting: Internal Medicine

## 2020-05-16 ENCOUNTER — Other Ambulatory Visit: Payer: Self-pay

## 2020-05-16 VITALS — BP 110/48 | HR 66 | Temp 97.8°F | Resp 18 | Ht 67.0 in | Wt 245.0 lb

## 2020-05-16 DIAGNOSIS — Z1211 Encounter for screening for malignant neoplasm of colon: Secondary | ICD-10-CM

## 2020-05-16 MED ORDER — SODIUM CHLORIDE 0.9 % IV SOLN: 500.0000 mL | Freq: Once | INTRAVENOUS | Status: DC

## 2020-05-16 NOTE — Progress Notes (Signed)
Medical history reviewed, VS assessed by Eugenie Norrie, RN

## 2020-05-16 NOTE — Patient Instructions (Signed)
Please read handouts provided. Continue present medications. Repeat colonoscopy in 10 years for screening purposes. Resume previous diet.     YOU HAD AN ENDOSCOPIC PROCEDURE TODAY AT [facility]:   Refer to the procedure report that was given to you for any specific questions about what was found during the examination.  If the procedure report does not answer your questions, please call your gastroenterologist to clarify.  If you requested that your care partner not be given the details of your procedure findings, then the procedure report has been included in a sealed envelope for you to review at your convenience later.  YOU SHOULD EXPECT: Some feelings of bloating in the abdomen. Passage of more gas than usual.  Walking can help get rid of the air that was put into your GI tract during the procedure and reduce the bloating. If you had a lower endoscopy (such as a colonoscopy or flexible sigmoidoscopy) you may notice spotting of blood in your stool or on the toilet paper. If you underwent a bowel prep for your procedure, you may not have a normal bowel movement for a few days.  Please Note:  You might notice some irritation and congestion in your nose or some drainage.  This is from the oxygen used during your procedure.  There is no need for concern and it should clear up in a day or so.  SYMPTOMS TO REPORT IMMEDIATELY:   Following lower endoscopy (colonoscopy or flexible sigmoidoscopy):  Excessive amounts of blood in the stool  Significant tenderness or worsening of abdominal pains  Swelling of the abdomen that is new, acute  Fever of 100F or higher    For urgent or emergent issues, a gastroenterologist can be reached at any hour by calling [phone]. Do not use MyChart messaging for urgent concerns.    DIET:  We do recommend a small meal at first, but then you may proceed to your regular diet.  Drink plenty of fluids but you should avoid alcoholic beverages  for 24 hours.  ACTIVITY:  You should plan to take it easy for the rest of today and you should NOT DRIVE or use heavy machinery until tomorrow (because of the sedation medicines used during the test).    FOLLOW UP: Our staff will call the number listed on your records 48-72 hours following your procedure to check on you and address any questions or concerns that you may have regarding the information given to you following your procedure. If we do not reach you, we will leave a message.  We will attempt to reach you two times.  During this call, we will ask if you have developed any symptoms of COVID 19. If you develop any symptoms (ie: fever, flu-like symptoms, shortness of breath, cough etc.) before then, please call [phone].  If you test positive for Covid 19 in the 2 weeks post procedure, please call and report this information to [place].    If any biopsies were taken you will be contacted by phone or by letter within the next 1-3 weeks.  Please call us at [phone] if you have not heard about the biopsies in 3 weeks.    SIGNATURES/CONFIDENTIALITY: You and/or your care partner have signed paperwork which will be entered into your electronic medical record.  These signatures attest to the fact that that the information above on your After Visit Summary has been reviewed and is understood.  Full responsibility of the confidentiality of this discharge information lies  with you and/or your care-partner.

## 2020-05-16 NOTE — Op Note (Signed)
[facility] Patient Name: Judy [name] Procedure Date: 05/16/2020 2:11 PM MRN: [id number] Endoscopist: Jerene Bears , MD Age: 55 Referring MD:  Date of Birth: [birth year] Gender: Female Account #: [id number] Procedure:                Colonoscopy Indications:              Screening for colorectal malignant neoplasm, This                            is the patient's first colonoscopy Medicines:                Monitored Anesthesia Care Procedure:                Pre-Anesthesia Assessment:                           - Prior to the procedure, a History and Physical                            was performed, and patient medications and                            allergies were reviewed. The patient's tolerance of                            previous anesthesia was also reviewed. The risks                            and benefits of the procedure and the sedation                            options and risks were discussed with the patient.                            All questions were answered, and informed consent                            was obtained. Prior Anticoagulants: The patient has                            taken no previous anticoagulant or antiplatelet                            agents. ASA Grade Assessment: III - A patient with                            severe systemic disease. After reviewing the risks                            and benefits, the patient was deemed in                            satisfactory condition to undergo the procedure.  After obtaining informed consent, the colonoscope                            was passed under direct vision. Throughout the                            procedure, the patient's blood pressure, pulse, and                            oxygen saturations were monitored continuously. The                            Olympus PCF-H190DL (#[id number]) Colonoscope was                            introduced through the anus and  advanced to the the                            cecum, identified by appendiceal orifice and                            ileocecal valve. The colonoscopy was performed                            without difficulty. The patient tolerated the                            procedure well. The quality of the bowel                            preparation was good. The ileocecal valve,                            appendiceal orifice, and rectum were photographed. Scope In: 2:21:44 PM Scope Out: 2:37:49 PM Scope Withdrawal Time: 0 hours 12 minutes 22 seconds  Total Procedure Duration: 0 hours 16 minutes 5 seconds  Findings:                 The digital rectal exam was normal.                           Multiple small and large-mouthed diverticula were                            found from ascending colon to sigmoid colon.                           The exam was otherwise without abnormality on                            direct and retroflexion views. Complications:            No immediate complications. Estimated Blood Loss:     Estimated blood loss: none. Impression:               - Moderate diverticulosis  from ascending colon to                            sigmoid colon.                           - The examination was otherwise normal on direct                            and retroflexion views.                           - No specimens collected. Recommendation:           - Patient has a contact number available for                            emergencies. The signs and symptoms of potential                            delayed complications were discussed with the                            patient. Return to normal activities tomorrow.                            Written discharge instructions were provided to the                            patient.                           - Resume previous diet.                           - Continue present medications.                           - Repeat colonoscopy in  10 years for screening                            purposes. Jerene Bears, MD 05/16/2020 2:44:18 PM This report has been signed electronically.

## 2020-05-16 NOTE — Progress Notes (Signed)
PT taken to PACU. Monitors in place. VSS. Report given to RN. 

## 2020-05-20 ENCOUNTER — Telehealth: Payer: Self-pay | Admitting: *Deleted

## 2020-05-20 NOTE — Telephone Encounter (Signed)
  Follow up Call-  Call back number 05/16/2020  Post procedure Call Back phone  # [phone]  Permission to leave phone message Yes  Some recent data might be hidden     Patient questions:  Do you have a fever, pain , or abdominal swelling? No. Pain Score  0 *  Have you tolerated food without any problems? Yes.    Have you been able to return to your normal activities? Yes.    Do you have any questions about your discharge instructions: Diet   No. Medications  No. Follow up visit  No.  Do you have questions or concerns about your Care? No.  Actions: * If pain score is 4 or above: No action needed, pain <4.  1. Have you developed a fever since your procedure? no  2.   Have you had an respiratory symptoms (SOB or cough) since your procedure? no  3.   Have you tested positive for COVID 19 since your procedure no  4.   Have you had any family members/close contacts diagnosed with the COVID 19 since your procedure?  no   If yes to any of these questions please route to Joylene John, RN and Joella Prince, RN

## 2020-05-26 DIAGNOSIS — G609 Hereditary and idiopathic neuropathy, unspecified: Secondary | ICD-10-CM | POA: Diagnosis not present

## 2020-05-26 DIAGNOSIS — K5900 Constipation, unspecified: Secondary | ICD-10-CM | POA: Diagnosis not present

## 2020-05-26 DIAGNOSIS — M2550 Pain in unspecified joint: Secondary | ICD-10-CM | POA: Diagnosis not present

## 2020-05-26 DIAGNOSIS — R0602 Shortness of breath: Secondary | ICD-10-CM | POA: Diagnosis not present

## 2020-05-28 DIAGNOSIS — R5383 Other fatigue: Secondary | ICD-10-CM | POA: Diagnosis not present

## 2020-05-28 DIAGNOSIS — E785 Hyperlipidemia, unspecified: Secondary | ICD-10-CM | POA: Diagnosis not present

## 2020-05-28 DIAGNOSIS — M2550 Pain in unspecified joint: Secondary | ICD-10-CM | POA: Diagnosis not present

## 2020-05-28 DIAGNOSIS — I10 Essential (primary) hypertension: Secondary | ICD-10-CM | POA: Diagnosis not present

## 2020-06-24 ENCOUNTER — Telehealth: Payer: Self-pay | Admitting: Cardiovascular Disease

## 2020-06-24 ENCOUNTER — Other Ambulatory Visit: Payer: Self-pay | Admitting: Cardiovascular Disease

## 2020-06-24 MED ORDER — METOPROLOL TARTRATE 25 MG PO TABS: ORAL_TABLET | ORAL | 0 refills | Status: DC

## 2020-06-24 NOTE — Telephone Encounter (Signed)
   *  STAT* If patient is at the pharmacy, call can be transferred to refill team.   1. Which medications need to be refilled? (please list name of each medication and dose if known) metoprolol tartrate (LOPRESSOR) 25 MG tablet  2. Which pharmacy/location (including street and city if local pharmacy) is medication to be sent to?[facility] - [place], [place] - [address]  3. Do they need a 30 day or 90 day supply? 90 days  Pt made an appt with Dr. Oval Linsey on 09/13 at 8:40 am. Also pt need refill today pt is out of meds

## 2020-06-26 DIAGNOSIS — R5383 Other fatigue: Secondary | ICD-10-CM | POA: Diagnosis not present

## 2020-06-26 DIAGNOSIS — Z79899 Other long term (current) drug therapy: Secondary | ICD-10-CM | POA: Diagnosis not present

## 2020-06-26 DIAGNOSIS — R1013 Epigastric pain: Secondary | ICD-10-CM | POA: Diagnosis not present

## 2020-06-26 DIAGNOSIS — M2550 Pain in unspecified joint: Secondary | ICD-10-CM | POA: Diagnosis not present

## 2020-07-21 DIAGNOSIS — I10 Essential (primary) hypertension: Secondary | ICD-10-CM | POA: Diagnosis not present

## 2020-07-21 DIAGNOSIS — M79609 Pain in unspecified limb: Secondary | ICD-10-CM | POA: Diagnosis not present

## 2020-07-21 DIAGNOSIS — Z79899 Other long term (current) drug therapy: Secondary | ICD-10-CM | POA: Diagnosis not present

## 2020-07-21 DIAGNOSIS — M2550 Pain in unspecified joint: Secondary | ICD-10-CM | POA: Diagnosis not present

## 2020-07-24 DIAGNOSIS — Z79899 Other long term (current) drug therapy: Secondary | ICD-10-CM | POA: Diagnosis not present

## 2020-07-24 DIAGNOSIS — M79609 Pain in unspecified limb: Secondary | ICD-10-CM | POA: Diagnosis not present

## 2020-07-24 DIAGNOSIS — E669 Obesity, unspecified: Secondary | ICD-10-CM | POA: Diagnosis not present

## 2020-07-24 DIAGNOSIS — G609 Hereditary and idiopathic neuropathy, unspecified: Secondary | ICD-10-CM | POA: Diagnosis not present

## 2020-08-11 DIAGNOSIS — H35033 Hypertensive retinopathy, bilateral: Secondary | ICD-10-CM | POA: Diagnosis not present

## 2020-08-21 DIAGNOSIS — Z79899 Other long term (current) drug therapy: Secondary | ICD-10-CM | POA: Diagnosis not present

## 2020-08-21 DIAGNOSIS — E669 Obesity, unspecified: Secondary | ICD-10-CM | POA: Diagnosis not present

## 2020-08-21 DIAGNOSIS — G894 Chronic pain syndrome: Secondary | ICD-10-CM | POA: Diagnosis not present

## 2020-08-21 DIAGNOSIS — R5383 Other fatigue: Secondary | ICD-10-CM | POA: Diagnosis not present

## 2020-09-23 ENCOUNTER — Encounter (HOSPITAL_BASED_OUTPATIENT_CLINIC_OR_DEPARTMENT_OTHER): Payer: Self-pay | Admitting: *Deleted

## 2020-09-23 ENCOUNTER — Other Ambulatory Visit: Payer: Self-pay

## 2020-09-23 ENCOUNTER — Telehealth (HOSPITAL_BASED_OUTPATIENT_CLINIC_OR_DEPARTMENT_OTHER): Payer: Self-pay | Admitting: Cardiovascular Disease

## 2020-09-23 ENCOUNTER — Other Ambulatory Visit: Payer: Self-pay | Admitting: Cardiovascular Disease

## 2020-09-23 ENCOUNTER — Ambulatory Visit (INDEPENDENT_AMBULATORY_CARE_PROVIDER_SITE_OTHER): Payer: BC Managed Care – PPO | Admitting: Cardiovascular Disease

## 2020-09-23 ENCOUNTER — Encounter (HOSPITAL_BASED_OUTPATIENT_CLINIC_OR_DEPARTMENT_OTHER): Payer: Self-pay | Admitting: Cardiovascular Disease

## 2020-09-23 ENCOUNTER — Ambulatory Visit (INDEPENDENT_AMBULATORY_CARE_PROVIDER_SITE_OTHER): Payer: BC Managed Care – PPO

## 2020-09-23 VITALS — BP 122/76 | HR 68 | Ht 67.0 in | Wt 245.3 lb

## 2020-09-23 DIAGNOSIS — I10 Essential (primary) hypertension: Secondary | ICD-10-CM | POA: Diagnosis not present

## 2020-09-23 DIAGNOSIS — I421 Obstructive hypertrophic cardiomyopathy: Secondary | ICD-10-CM | POA: Diagnosis not present

## 2020-09-23 DIAGNOSIS — Z5181 Encounter for therapeutic drug level monitoring: Secondary | ICD-10-CM

## 2020-09-23 DIAGNOSIS — E669 Obesity, unspecified: Secondary | ICD-10-CM

## 2020-09-23 MED ORDER — OZEMPIC (1 MG/DOSE) 4 MG/3ML ~~LOC~~ SOPN: PEN_INJECTOR | SUBCUTANEOUS | 5 refills | Status: DC

## 2020-09-23 MED ORDER — OZEMPIC (0.25 OR 0.5 MG/DOSE) 2 MG/1.5ML ~~LOC~~ SOPN: PEN_INJECTOR | SUBCUTANEOUS | 1 refills | Status: DC

## 2020-09-23 NOTE — Telephone Encounter (Addendum)
Left detailed message--ok per DPR--explaining to pt that Prior Auth through cover my meds is being completed for her Ozempic, prescribed today by Dr. Oval Linsey. Stated that they are asking if ok to notify her via text or email whether it is approved or denied. Asked pt to call back to let us know and I will put that information in. Otherwise, it will come as a letter in the mail and to the pharmacy and to our office.

## 2020-09-23 NOTE — Assessment & Plan Note (Addendum)
Her septum was 2 cm on MRI.  Doing well.  Symptoms currently controlled on metoprolol and I do not hear a murmur on exam.  Ambutory monitor was ordered but not completed. We will have her wear a 3 day Zio to assess for PVC/NSVT.  Repeat echo 1 year.

## 2020-09-23 NOTE — Assessment & Plan Note (Signed)
Blood pressure is well-controlled on HCTZ and metoprolol.  She has had some cramping and subjective swelling in her hands and feet.  We will check a basic metabolic panel and magnesium next week.  She just started potassium supplementation yesterday.  Consider switching to spironolactone.

## 2020-09-23 NOTE — Assessment & Plan Note (Signed)
She continues to struggle with her weight.  She has been taking phentermine without much success.  She notes that she often only eats 1 meal per day.  We will refer her to the healthy weight and wellness clinic.  We discussed the fact that she probably needs to be increasing her protein and eating more regularly.  We will also start her on Ozempic at 0.25 mg once a week for 4 weeks then 0.5 mg for 4 weeks then 1 mg/week.  Encouraged her to increase her exercise to at least 150 minutes weekly.

## 2020-09-23 NOTE — Telephone Encounter (Signed)
PA submitted through CoverMyMeds.  Carmon Ginsberg ([facility]) Rx #: [id number] Ozempic (0.25 or 0.5 MG/DOSE) '2MG'$ /1.5ML pen-injectors   Form [employer] Electronic PA Form (2017 NCPDP) Created 7 hours ago Sent to Plan 6 minutes ago Plan Response 6 minutes ago Submit Clinical Questions 2 minutes ago Determination Wait for Determination

## 2020-09-23 NOTE — Patient Instructions (Addendum)
Medication Instructions:  STOP PHENTERMINE   START OZEMPIC  INJECT 0.25 MG WEEKLY FOR 4 WEEKS  INCREASE TO 0.5 MG WEEKLY FOR 4 WEEKS INCREASE TO 1 MG WEEKLY FROM THEN ON    *If you need a refill on your cardiac medications before your next appointment, please call your pharmacy*   Lab Work: BMET/MAGNESIUM 1 WEEK   If you have labs (blood work) drawn today and your tests are completely normal, you will receive your results only by: MyChart Message (if you have MyChart) OR A paper copy in the mail If you have any lab test that is abnormal or we need to change your treatment, we will call you to review the results.  Testing/Procedures: 3 DAY ZIO MONITOR   Follow-Up: At [facility], you and your health needs are our priority.  As part of our continuing mission to provide you with exceptional heart care, we have created designated Provider Care Teams.  These Care Teams include your primary Cardiologist (physician) and Advanced Practice Providers (APPs -  Physician Assistants and Nurse Practitioners) who all work together to provide you with the care you need, when you need it.  We recommend signing up for the patient portal called "MyChart".  Sign up information is provided on this After Visit Summary.  MyChart is used to connect with patients for Virtual Visits (Telemedicine).  Patients are able to view lab/test results, encounter notes, upcoming appointments, etc.  Non-urgent messages can be sent to your provider as well.   To learn more about what you can do with MyChart, go to [email or web address].    Your next appointment:   4 month(s)  The format for your next appointment:   In Person  Provider:   Skeet Latch, MD  You have been referred to [place]  Where: [place] Address: [place] [address] Phone: [phone]  IF YOU DO NOT HEAR FROM THEM IN 2 WEEKS YOU CAN CALL THEM DIRECTLY AT [place]

## 2020-09-23 NOTE — Progress Notes (Signed)
Cardiology Office Note   Date:  09/23/2020   ID:  Judy [name], DOB Jul 11, 1965, MRN [id number]  PCP:  Antonietta Jewel, MD  Cardiologist:   Skeet Latch, MD   No chief complaint on file.     History of Present Illness: Judy [name] is a 55 y.o. female with HCM and hypertension who presents for follow up.  She was initially seen in the hospital 01/2017 with chest pain.  Cardiac enzymes were negative.  Her EKG was concerning for hypertrophic cardiomyopathy or LVH with repolarization abnormalities.  Her chest pain was thought to be atypical.  Her exam was consistent with HOCM.  During that hospitalization she had a short run of NSVT.  It was recommended that she get a cardiac MRI but this did not occur prior to discharge.  She was started on metoprolol.  She was last seen by Kerin Ransom, PA-C on 06/2019.  At the time she has been out of her medications for over a month and felt poorly.  A repeat echocardiogram revealed LVEF 60 to 65% with mild asymmetric septal hypertrophy.  There was mild to moderate mitral regurgitation and grade 1 diastolic dysfunction. She has three daughters.  They all had echocardiograms when running track.  There is no report of HCM.  No family history of SCD.  Today, she is feeling okay overall. Recently she has been assisting her daughters beginning higher education. She reports her breathing has improved since her last visit. Sometimes she is still short of breath such as when walking to work from the parking lot. Lately she has been drinking ginger tea which seems to help improve her shortness of breath. If she is not feeling hungry, she may not eat as much as she needs to. Often she does not bother with meals now that she does not need to prepare them for all of her family. She may snack on fruits instead. Very recently she started drinking protein shakes. Occasionally, she tries to exercise but is discouraged by bothersome edema in her bilateral hands and ankles. They  often feel tight when this occurs but do not necessarily look swollen. She denies any recurrent exertional chest pain. Also, she complains of muscle cramps in her hands which she attributes to carpal tunnel and medication side effects. She denies any palpitations, lightheadedness, headaches, syncope, orthopnea, or PND.   Past Medical History:  Diagnosis Date   Essential hypertension 06/14/2019   HOCM (hypertrophic obstructive cardiomyopathy) ([place]) 02/04/2017   Hypertension    Obesity (BMI 30-39.9) 08/21/2019    Past Surgical History:  Procedure Laterality Date   BARTHOLIN CYST MARSUPIALIZATION     CESAREAN SECTION     X3 with BTL   DILATATION & CURETTAGE/HYSTEROSCOPY WITH TRUECLEAR N/A 04/12/2013   Procedure: DILATATION & CURETTAGE/HYSTEROSCOPY ;  Surgeon: Anastasio Auerbach, MD;  Location: [facility];  Service: Gynecology;  Laterality: N/A;   TUBAL LIGATION       Current Outpatient Medications  Medication Sig Dispense Refill   hydrochlorothiazide (HYDRODIURIL) 25 MG tablet Take 25 mg by mouth every morning.     metoprolol tartrate (LOPRESSOR) 25 MG tablet TAKE 1/2 TABLET BY MOUTH 2 TIMES DAILY Please keep your upcoming appointment. 90 tablet 0   potassium chloride (KLOR-CON) 10 MEQ tablet Take 10 mEq by mouth every other day.     Semaglutide, 1 MG/DOSE, (OZEMPIC, 1 MG/DOSE,) 4 MG/3ML SOPN AFTER YOU FINISH FIRST 8 WEEKS INJECT 1 MG WEEKLY 1 mL 5   Semaglutide,0.25 or  0.'5MG'$ /DOS, (OZEMPIC, 0.25 OR 0.5 MG/DOSE,) 2 MG/1.5ML SOPN INJECT 0.25 MG WEEKLY FOR 4 WEEKS THEN INCREASE TO 0.5 MG WEEKLY FOR 4 WEEKS 1 mL 1   No current facility-administered medications for this visit.    Allergies:   Naproxen sodium    Social History:  The patient  reports that she has never smoked. She has never used smokeless tobacco. She reports current alcohol use. She reports that she does not use drugs.   Family History:  The patient's family history includes Diabetes in her father; Heart attack in her sister;  Heart disease in her mother.    ROS:   Please see the history of present illness. (+) Shortness of breath (+) Edema, bilateral ankles and hands (+) Muscle cramps in bilateral hands All other systems are reviewed and negative.    PHYSICAL EXAM: VS:  BP 122/76   Pulse 68   Ht '5\' 7"'$  (1.702 m)   Wt 245 lb 4.8 oz (111.3 kg)   BMI 38.42 kg/m  , BMI Body mass index is 38.42 kg/m. GENERAL:  Well appearing HEENT:  Pupils equal round and reactive, fundi not visualized, oral mucosa unremarkable NECK:  No jugular venous distention, waveform within normal limits, carotid upstroke brisk and symmetric, no bruits LUNGS:  Clear to auscultation bilaterally HEART:  RRR.  PMI not displaced or sustained,S1 and S2 within normal limits, no S3, no S4, no murmurs, clicks, no rubs ABD:  Flat, positive bowel sounds normal in frequency in pitch, no bruits, no rebound, no guarding, no midline pulsatile mass, no hepatomegaly, no splenomegaly EXT:  2 plus pulses throughout, no edema, no cyanosis no clubbing SKIN:  No rashes no nodules NEURO:  Cranial nerves II through XII grossly intact, motor grossly intact throughout PSYCH:  Cognitively intact, oriented to person place and time   EKG:   09/23/2020: Sinus rhythm. Rate 68 bpm. LVH. Repolarization abnormalities. 08/21/2019: sinus rhythm.  Rate 75 bpm.  Nonspecific T wave abnormalities.  Cardiac MRI 10/08/2019: IMPRESSION: 1. Asymmetric LV hypertrophy measuring up to 32m in mid inferoseptum (637min posterior wall), consistent with hypertrophic cardiomyopathy   2. RV insertion site late gadolinium enhancement, consistent with HCM. LGE accounts for 2% of total myocardial mass   3.  Normal LV size with hyperdynamic systolic function (EF [id number]  4.  Normal RV size and systolic function (EF [id number]  5.  Mild to moderate mitral regurgitation (regurgitant fraction 20%)   6.  Mild tricuspid regurgitation (regurgitant fraction 16%)  Echo 06/2019: IMPRESSIONS    1. Normal LV function; grade 1 diastolic dysfunction; mild asymmetric  septal hypertrophy; no LVOT gradient at rest or with valsalva; mild to  moderate MR; mild LAE. Findings possibly consistent with HCM; suggest  cardiac MRI to further assess.   2. Left ventricular ejection fraction, by estimation, is 60 to 65%. The  left ventricle has normal function. The left ventricle has no regional  wall motion abnormalities. Left ventricular diastolic parameters are  consistent with Grade I diastolic  dysfunction (impaired relaxation).   3. Right ventricular systolic function is normal. The right ventricular  size is normal. There is normal pulmonary artery systolic pressure.   4. Left atrial size was mildly dilated.   5. The mitral valve is normal in structure. Mild to moderate mitral valve  regurgitation. No evidence of mitral stenosis.   6. The aortic valve is tricuspid. Aortic valve regurgitation is not  visualized. Mild aortic valve sclerosis is present, with no evidence of  aortic valve stenosis.   7. The inferior vena cava is normal in size with greater than 50%  respiratory variability, suggesting right atrial pressure of 3 mmHg.    Recent Labs: No results found for requested labs within last 8760 hours.    Lipid Panel    Component Value Date/Time   CHOL 203 (H) 08/03/2017 1720   TRIG 74 08/03/2017 1720   HDL 57 08/03/2017 1720   CHOLHDL 3.6 08/03/2017 1720   VLDL 26 02/04/2017 0301   LDLCALC 129 (H) 08/03/2017 1720      Wt Readings from Last 3 Encounters:  09/23/20 245 lb 4.8 oz (111.3 kg)  05/16/20 245 lb (111.1 kg)  03/26/20 245 lb (111.1 kg)      ASSESSMENT AND PLAN: HOCM (hypertrophic obstructive cardiomyopathy) (HCC) Her septum was 2 cm on MRI.  Doing well.  Symptoms currently controlled on metoprolol and I do not hear a murmur on exam.  Ambutory monitor was ordered but not completed. We will have her wear a 3 day Zio to assess for PVC/NSVT.  Repeat echo 1 year.    Essential hypertension Blood pressure is well-controlled on HCTZ and metoprolol.  She has had some cramping and subjective swelling in her hands and feet.  We will check a basic metabolic panel and magnesium next week.  She just started potassium supplementation yesterday.  Consider switching to spironolactone.  Obesity (BMI 30-39.9) She continues to struggle with her weight.  She has been taking phentermine without much success.  She notes that she often only eats 1 meal per day.  We will refer her to the healthy weight and wellness clinic.  We discussed the fact that she probably needs to be increasing her protein and eating more regularly.  We will also start her on Ozempic at 0.25 mg once a week for 4 weeks then 0.5 mg for 4 weeks then 1 mg/week.  Encouraged her to increase her exercise to at least 150 minutes weekly.    Current medicines are reviewed at length with the patient today.  The patient does not have concerns regarding medicines.  The following changes have been made:  None  Labs/ tests ordered today include:   Orders Placed This Encounter  Procedures   Magnesium   Basic metabolic panel   Ambulatory referral to [facility] [place] (3-14 DAYS)   EKG 12-Lead      Disposition:   FU with Corinthian Mizrahi C. Oval Linsey, MD, [facility] in 3 months.   I,Mathew Stumpf,acting as a Education administrator for Skeet Latch, MD.,have documented all relevant documentation on the behalf of Skeet Latch, MD,as directed by  Skeet Latch, MD while in the presence of Skeet Latch, MD.  I, [place] Oval Linsey, MD have reviewed all documentation for this visit.  The documentation of the exam, diagnosis, procedures, and orders on 09/23/2020 are all accurate and complete.   Signed, Derreck Wiltsey C. Oval Linsey, MD, [facility]  09/23/2020 9:57 AM    [facility]

## 2020-09-29 NOTE — Telephone Encounter (Addendum)
Judy [name] (Key: [id number])  This request has received an Unfavorable outcome.  An eAppeal may be available. View the bottom of the request to see if an eAppeal is available along with any additional information provided by [employer].   --Also received a fax stating coverage cannot be approved at this time as coverage is provided for the diagnosis of Diabetes Mellitus type 2. Can try for an appeal in CoverMyMeds.   Please advise. Thank you!

## 2020-10-08 NOTE — Telephone Encounter (Signed)
  October 05, 2020 Skeet Latch, MD to Ernie Hew D  Me  Rockne Menghini, RPH-CPP     3:21 PM Will it cover [phone]?  Left message to call back

## 2020-10-13 NOTE — Telephone Encounter (Signed)
Would you like me to try a PA for [facility]? If so, what strength and instructions?

## 2020-10-14 NOTE — Telephone Encounter (Signed)
Insurance should cover Ozempic and patient should qualify for copay card.  Mancel Parsons is unavailable right now.

## 2020-10-14 NOTE — Telephone Encounter (Signed)
Will her insurance cover it?

## 2020-10-14 NOTE — Telephone Encounter (Signed)
Please advise. Thank you

## 2020-10-15 NOTE — Telephone Encounter (Signed)
FYI  Thank you Gerald Stabs!!

## 2020-10-16 DIAGNOSIS — Z79899 Other long term (current) drug therapy: Secondary | ICD-10-CM | POA: Diagnosis not present

## 2020-10-16 DIAGNOSIS — M2550 Pain in unspecified joint: Secondary | ICD-10-CM | POA: Diagnosis not present

## 2020-10-16 DIAGNOSIS — E669 Obesity, unspecified: Secondary | ICD-10-CM | POA: Diagnosis not present

## 2020-10-16 DIAGNOSIS — M79609 Pain in unspecified limb: Secondary | ICD-10-CM | POA: Diagnosis not present

## 2020-11-10 ENCOUNTER — Telehealth (HOSPITAL_BASED_OUTPATIENT_CLINIC_OR_DEPARTMENT_OTHER): Payer: Self-pay | Admitting: *Deleted

## 2020-11-10 NOTE — Telephone Encounter (Signed)
Left message to call back  

## 2020-11-10 NOTE — Telephone Encounter (Signed)
-----   Message from Skeet Latch, MD sent at 11/04/2020  8:21 AM EDT ----- Monitor showed some early beats from the top and bottom chambers of the heart.  Continue metoprolol.  If she has bothered by these episodes, then we can increase the dose.

## 2020-11-12 DIAGNOSIS — H9202 Otalgia, left ear: Secondary | ICD-10-CM | POA: Diagnosis not present

## 2021-01-07 ENCOUNTER — Telehealth: Payer: Self-pay | Admitting: Cardiovascular Disease

## 2021-01-07 NOTE — Telephone Encounter (Signed)
Patient returning call for monitor results. 

## 2021-01-07 NOTE — Telephone Encounter (Signed)
Patient never returned call Tried calling again went to [employer], highlighted comments and left message to call if any questions/concerns

## 2021-01-07 NOTE — Telephone Encounter (Signed)
Skeet Latch, MD  11/04/2020  8:21 AM EDT     Monitor showed some early beats from the top and bottom chambers of the heart.  Continue metoprolol.  If she has bothered by these episodes, then we can increase the dose.   Discussed with patient and she will discuss further with Dr Oval Linsey at follow up next month Had tried to reach patient prior to today, never heard back

## 2021-01-23 ENCOUNTER — Ambulatory Visit (INDEPENDENT_AMBULATORY_CARE_PROVIDER_SITE_OTHER): Payer: BC Managed Care – PPO | Admitting: Cardiovascular Disease

## 2021-01-23 ENCOUNTER — Encounter (HOSPITAL_BASED_OUTPATIENT_CLINIC_OR_DEPARTMENT_OTHER): Payer: Self-pay | Admitting: Cardiovascular Disease

## 2021-01-23 ENCOUNTER — Other Ambulatory Visit: Payer: Self-pay

## 2021-01-23 VITALS — BP 134/82 | HR 61 | Ht 67.0 in | Wt 250.6 lb

## 2021-01-23 DIAGNOSIS — R072 Precordial pain: Secondary | ICD-10-CM | POA: Diagnosis not present

## 2021-01-23 DIAGNOSIS — I10 Essential (primary) hypertension: Secondary | ICD-10-CM

## 2021-01-23 DIAGNOSIS — R7303 Prediabetes: Secondary | ICD-10-CM

## 2021-01-23 DIAGNOSIS — E669 Obesity, unspecified: Secondary | ICD-10-CM | POA: Diagnosis not present

## 2021-01-23 DIAGNOSIS — I421 Obstructive hypertrophic cardiomyopathy: Secondary | ICD-10-CM | POA: Diagnosis not present

## 2021-01-23 HISTORY — DX: Prediabetes: R73.03

## 2021-01-23 NOTE — Progress Notes (Addendum)
Cardiology Office Note   Date:  01/23/2021   ID:  Judy [name], DOB [birth year], MRN [id number]  PCP:  Antonietta Jewel, MD  Cardiologist:   Skeet Latch, MD   No chief complaint on file.    History of Present Illness: Judy [name] is a 56 y.o. female with HCM and hypertension who presents for follow up.  She was initially seen in the hospital 01/2017 with chest pain.  Cardiac enzymes were negative.  Her EKG was concerning for hypertrophic cardiomyopathy or LVH with repolarization abnormalities.  Her chest pain was thought to be atypical.  Her exam was consistent with HOCM.  During that hospitalization she had a short run of NSVT.  It was recommended that she get a cardiac MRI but this did not occur prior to discharge.  She was started on metoprolol.  She was last seen by Kerin Ransom, PA-C on 06/2019.  At the time she has been out of her medications for over a month and felt poorly.  A repeat echocardiogram revealed LVEF 60 to 65% with mild asymmetric septal hypertrophy.  There was mild to moderate mitral regurgitation and grade 1 diastolic dysfunction. She has three daughters.  They all had echocardiograms when running track.  There is no report of HCM.  No family history of SCD.  At her last appointment she was feeling well but had some persistent dyspnea on exertion.  We attempted to switch her from phentermine to Ozempic but it was denied by her insurance.  She wore an ambulatory monitor that showed up to 4 beats of NSVT and up to 8 beats of SVT.  She had rare PVCs.  Her main complaint now is feeling tired all the time.  She switched to working third shift and thinks this might be why.  She does not snore.  She continues to struggle to lose weight.  Lately she has noticed some mild chest pain when walking.  She also gets short of breath when it happens.  She denies any palpitations, lightheadedness, or dizziness.  She has not had any lower extremity edema, orthopnea, or PND.  She is not  getting much exercise.   Past Medical History:  Diagnosis Date   Essential hypertension 06/14/2019   HOCM (hypertrophic obstructive cardiomyopathy) ([place]) 02/04/2017   Hypertension    Obesity (BMI 30-39.9) 08/21/2019   Pre-diabetes 01/23/2021    Past Surgical History:  Procedure Laterality Date   BARTHOLIN CYST MARSUPIALIZATION     CESAREAN SECTION     X3 with BTL   DILATATION & CURETTAGE/HYSTEROSCOPY WITH TRUECLEAR N/A 04/12/2013   Procedure: DILATATION & CURETTAGE/HYSTEROSCOPY ;  Surgeon: Anastasio Auerbach, MD;  Location: [facility];  Service: Gynecology;  Laterality: N/A;   TUBAL LIGATION       Current Outpatient Medications  Medication Sig Dispense Refill   hydrochlorothiazide (HYDRODIURIL) 25 MG tablet Take 25 mg by mouth every morning.     metoprolol tartrate (LOPRESSOR) 25 MG tablet TAKE 1/2 TABLET BY MOUTH 2 TIMES DAILY 90 tablet 1   potassium chloride (KLOR-CON) 10 MEQ tablet Take 10 mEq by mouth every other day.     No current facility-administered medications for this visit.    Allergies:   Naproxen sodium    Social History:  The patient  reports that she has never smoked. She has never used smokeless tobacco. She reports current alcohol use. She reports that she does not use drugs.   Family History:  The patient's family history includes Diabetes  in her father; Heart attack in her sister; Heart disease in her mother.    ROS:   Please see the history of present illness. (+) Shortness of breath (+) Edema, bilateral ankles and hands (+) Muscle cramps in bilateral hands All other systems are reviewed and negative.    PHYSICAL EXAM: VS:  BP 134/82 (BP Location: Left Arm, Patient Position: Sitting, Cuff Size: Large)    Pulse 61    Ht 5\' 7"  (1.702 m)    Wt 250 lb 9.6 oz (113.7 kg)    SpO2 99%    BMI 39.25 kg/m  , BMI Body mass index is 39.25 kg/m. GENERAL:  Well appearing HEENT:  Pupils equal round and reactive, fundi not visualized, oral mucosa unremarkable NECK:  No  jugular venous distention, waveform within normal limits, carotid upstroke brisk and symmetric, no bruits LUNGS:  Clear to auscultation bilaterally HEART:  RRR.  PMI not displaced or sustained,S1 and S2 within normal limits, no S3, no S4, no murmurs, clicks, no rubs ABD:  Flat, positive bowel sounds normal in frequency in pitch, no bruits, no rebound, no guarding, no midline pulsatile mass, no hepatomegaly, no splenomegaly EXT:  2 plus pulses throughout, no edema, no cyanosis no clubbing SKIN:  No rashes no nodules NEURO:  Cranial nerves II through XII grossly intact, motor grossly intact throughout PSYCH:  Cognitively intact, oriented to person place and time   EKG:   09/23/2020: Sinus rhythm. Rate 68 bpm. LVH. Repolarization abnormalities. 08/21/2019: sinus rhythm.  Rate 75 bpm.  Nonspecific T wave abnormalities.  Cardiac MRI 10/08/2019: IMPRESSION: 1. Asymmetric LV hypertrophy measuring up to 66mm in mid inferoseptum (76mm in posterior wall), consistent with hypertrophic cardiomyopathy   2. RV insertion site late gadolinium enhancement, consistent with HCM. LGE accounts for 2% of total myocardial mass   3.  Normal LV size with hyperdynamic systolic function (EF 28%)   4.  Normal RV size and systolic function (EF 36%)   5.  Mild to moderate mitral regurgitation (regurgitant fraction 20%)   6.  Mild tricuspid regurgitation (regurgitant fraction 16%)  Echo 06/2019: IMPRESSIONS   1. Normal LV function; grade 1 diastolic dysfunction; mild asymmetric  septal hypertrophy; no LVOT gradient at rest or with valsalva; mild to  moderate MR; mild LAE. Findings possibly consistent with HCM; suggest  cardiac MRI to further assess.   2. Left ventricular ejection fraction, by estimation, is 60 to 65%. The  left ventricle has normal function. The left ventricle has no regional  wall motion abnormalities. Left ventricular diastolic parameters are  consistent with Grade I diastolic  dysfunction  (impaired relaxation).   3. Right ventricular systolic function is normal. The right ventricular  size is normal. There is normal pulmonary artery systolic pressure.   4. Left atrial size was mildly dilated.   5. The mitral valve is normal in structure. Mild to moderate mitral valve  regurgitation. No evidence of mitral stenosis.   6. The aortic valve is tricuspid. Aortic valve regurgitation is not  visualized. Mild aortic valve sclerosis is present, with no evidence of  aortic valve stenosis.   7. The inferior vena cava is normal in size with greater than 50%  respiratory variability, suggesting right atrial pressure of 3 mmHg.    Recent Labs: No results found for requested labs within last 8760 hours.    Lipid Panel    Component Value Date/Time   CHOL 203 (H) 08/03/2017 1720   TRIG 74 08/03/2017 1720   HDL  57 08/03/2017 1720   CHOLHDL 3.6 08/03/2017 1720   VLDL 26 02/04/2017 0301   LDLCALC 129 (H) 08/03/2017 1720   12/18/2019: Total cholesterol 194, triglycerides 76, HDL 50, LDL 127 Sodium 140, potassium 3.3, BUN 13, creatinine 0.86 TSH 1.4 W BC 10.3, hemoglobin 12.9, hematocrit 38.2, platelets 321    Wt Readings from Last 3 Encounters:  01/23/21 250 lb 9.6 oz (113.7 kg)  09/23/20 245 lb 4.8 oz (111.3 kg)  05/16/20 245 lb (111.1 kg)      ASSESSMENT AND PLAN: Essential hypertension Blood pressure slightly above goal of less than 130/80.  She is not getting any exercise.  She is so close that I suspect with exercise and weight loss she would be at goal.  Continue hydrochlorothiazide for now.  She will start working on going for a walk when she gets home from her shift before going to bed.  HOCM (hypertrophic obstructive cardiomyopathy) (HCC) Septum was 2 cm on MRI.  She had short runs of NSVT and some PVCs on her monitor.  She has no family history of sudden cardiac death and no significant outflow tract obstruction.  Overall her risk is low and her age is 41.  I do not  recommend ICD at this time.  We will hold her metoprolol for few days given that she is having fatigue.  If her fatigue improves we will need to try calcium channel blocker instead.  Obesity (BMI 30-39.9) She continues to struggle with weight gain.  Referral was placed to healthy weight and wellness but she is on the wait list.  We did try to get her on Ozempic.  She did have prediabetes on her last hemoglobin A1c.  We will repeat today.  Given this, she should be able to get the Ozempic.  Continue working on try to increase exercise and limit carbohydrate intake.  Pre-diabetes Will keep working on weight loss and trying to get her Ozempic.   Current medicines are reviewed at length with the patient today.  The patient does not have concerns regarding medicines.  The following changes have been made:  None  Labs/ tests ordered today include:   Orders Placed This Encounter  Procedures   CT CORONARY MORPH W/CTA COR W/SCORE W/CA W/CM &/OR WO/CM   CBC with Differential/Platelet   TSH   Lipid panel   Comprehensive metabolic panel   HgB U3A     Disposition:   FU with Sila Sarsfield C. Oval Linsey, MD, [facility] in 3 months.    Signed, Greysyn Vanderberg C. Oval Linsey, MD, [facility]  01/23/2021 1:02 PM    [facility]

## 2021-01-23 NOTE — Telephone Encounter (Signed)
Patient seen in office and will follow up with Pharm D

## 2021-01-23 NOTE — Patient Instructions (Addendum)
Medication Instructions:   WILL REACH OUT TO PHARMACIST REGARDING OZEMPIC  HOLD YOUR METOPROLOL FOR A WEEK TO SEE IF YOUR FATIGUE IMPROVES  THE MORNING OF YOUR CARDIAC CT TAKE A FULL TABLET OF METOPROLOL   *If you need a refill on your cardiac medications before your next appointment, please call your pharmacy*  Lab Work: FASTING LP/CMET/A1C/TSH/CBC 1 WEEK PRIOR TO CT    If you have labs (blood work) drawn today and your tests are completely normal, you will receive your results only by: [place] (if you have MyChart) OR A paper copy in the mail If you have any lab test that is abnormal or we need to change your treatment, we will call you to review the results.  Testing/Procedures: Your physician has requested that you have cardiac CT. Cardiac computed tomography (CT) is a painless test that uses an x-ray machine to take clear, detailed pictures of your heart. For further information please visit [email or web address]. Please follow instruction sheet as given. ONCE INSURANCE HAS BEEN REVIEWED THE OFFICE WILL CALL YOU TO SCHEDULED IF YOU DO NOT HEAR IN 2 WEEKS CALL TO FOLLOW UP   Follow-Up: At [facility], you and your health needs are our priority.  As part of our continuing mission to provide you with exceptional heart care, we have created designated Provider Care Teams.  These Care Teams include your primary Cardiologist (physician) and Advanced Practice Providers (APPs -  Physician Assistants and Nurse Practitioners) who all work together to provide you with the care you need, when you need it.  We recommend signing up for the patient portal called "MyChart".  Sign up information is provided on this After Visit Summary.  MyChart is used to connect with patients for Virtual Visits (Telemedicine).  Patients are able to view lab/test results, encounter notes, upcoming appointments, etc.  Non-urgent messages can be sent to your provider as well.   To learn more about what you can  do with MyChart, go to [email or web address].    Your next appointment:   2 month(s)  The format for your next appointment:   In Person  Provider:   Overton Mam NP 03/27/2021 9:15 AM   Other Instructions    Your cardiac CT will be scheduled at one of the below locations:   [facility] [address] [place], [place] [address] [phone]  [place] [address] [place], [place] [address] [phone]  If scheduled at [facility], please arrive at the [facility] main entrance (entrance A) of [facility] 30 minutes prior to test start time. You can use the FREE valet parking offered at the main entrance (encouraged to control the heart rate for the test) Proceed to the [facility] Radiology Department (first floor) to check-in and test prep.  If scheduled at [facility], please arrive 15 mins early for check-in and test prep.  Please follow these instructions carefully (unless otherwise directed):  Hold all erectile dysfunction medications at least 3 days (72 hrs) prior to test.  On the Night Before the Test: Be sure to Drink plenty of water. Do not consume any caffeinated/decaffeinated beverages or chocolate 12 hours prior to your test. Do not take any antihistamines 12 hours prior to your test. If the patient has contrast allergy: Patient will need a prescription for Prednisone and very clear instructions (as follows): Prednisone 50 mg - take 13 hours prior to test Take another Prednisone 50  mg 7 hours prior to test Take another Prednisone 50 mg 1 hour prior to test Take Benadryl 50 mg 1 hour prior to test Patient must complete all four doses of above prophylactic medications. Patient will need a ride after test due to Benadryl.  On the Day of the Test: Drink plenty of water until 1 hour prior to the test. Do not eat any food 4 hours prior to the  test. You may take your regular medications prior to the test.  Take metoprolol (Lopressor) two hours prior to test. HOLD Furosemide/Hydrochlorothiazide morning of the test. FEMALES- please wear underwire-free bra if available, avoid dresses & tight clothing  After the Test: Drink plenty of water. After receiving IV contrast, you may experience a mild flushed feeling. This is normal. On occasion, you may experience a mild rash up to 24 hours after the test. This is not dangerous. If this occurs, you can take Benadryl 25 mg and increase your fluid intake. If you experience trouble breathing, this can be serious. If it is severe call 911 IMMEDIATELY. If it is mild, please call our office. If you take any of these medications: Glipizide/Metformin, Avandament, Glucavance, please do not take 48 hours after completing test unless otherwise instructed.  Please allow 2-4 weeks for scheduling of routine cardiac CTs. Some insurance companies require a pre-authorization which may delay scheduling of this test.   For non-scheduling related questions, please contact the cardiac imaging nurse navigator should you have any questions/concerns: Marchia Bond, Cardiac Imaging Nurse Navigator Gordy Clement, Cardiac Imaging Nurse Navigator [facility] Direct Office Dial: [phone]   For scheduling needs, including cancellations and rescheduling, please call [place], [phone].  Cardiac CT Angiogram A cardiac CT angiogram is a procedure to look at the heart and the area around the heart. It may be done to help find the cause of chest pains or other symptoms of heart disease. During this procedure, a substance called contrast dye is injected into the blood vessels in the area to be checked. A large X-ray machine, called a CT scanner, then takes detailed pictures of the heart and the surrounding area. The procedure is also sometimes called a coronary CT angiogram, coronary artery  scanning, or CTA. A cardiac CT angiogram allows the health care provider to see how well blood is flowing to and from the heart. The health care provider will be able to see if there are any problems, such as: Blockage or narrowing of the coronary arteries in the heart. Fluid around the heart. Signs of weakness or disease in the muscles, valves, and tissues of the heart. Tell a health care provider about: Any allergies you have. This is especially important if you have had a previous allergic reaction to contrast dye. All medicines you are taking, including vitamins, herbs, eye drops, creams, and over-the-counter medicines. Any blood disorders you have. Any surgeries you have had. Any medical conditions you have. Whether you are pregnant or may be pregnant. Any anxiety disorders, chronic pain, or other conditions you have that may increase your stress or prevent you from lying still. What are the risks? Generally, this is a safe procedure. However, problems may occur, including: Bleeding. Infection. Allergic reactions to medicines or dyes. Damage to other structures or organs. Kidney damage from the contrast dye that is used. Increased risk of cancer from radiation exposure. This risk is low. Talk with your health care provider about: The risks and benefits of testing. How you can receive the  lowest dose of radiation. What happens before the procedure? Wear comfortable clothing and remove any jewelry, glasses, dentures, and hearing aids. Follow instructions from your health care provider about eating and drinking. This may include: For 12 hours before the procedure -- avoid caffeine. This includes tea, coffee, soda, energy drinks, and diet pills. Drink plenty of water or other fluids that do not have caffeine in them. Being well hydrated can prevent complications. For 4-6 hours before the procedure -- stop eating and drinking. The contrast dye can cause nausea, but this is less likely if  your stomach is empty. Ask your health care provider about changing or stopping your regular medicines. This is especially important if you are taking diabetes medicines, blood thinners, or medicines to treat problems with erections (erectile dysfunction). What happens during the procedure?  Hair on your chest may need to be removed so that small sticky patches called electrodes can be placed on your chest. These will transmit information that helps to monitor your heart during the procedure. An IV will be inserted into one of your veins. You might be given a medicine to control your heart rate during the procedure. This will help to ensure that good images are obtained. You will be asked to lie on an exam table. This table will slide in and out of the CT machine during the procedure. Contrast dye will be injected into the IV. You might feel warm, or you may get a metallic taste in your mouth. You will be given a medicine called nitroglycerin. This will relax or dilate the arteries in your heart. The table that you are lying on will move into the CT machine tunnel for the scan. The person running the machine will give you instructions while the scans are being done. You may be asked to: Keep your arms above your head. Hold your breath. Stay very still, even if the table is moving. When the scanning is complete, you will be moved out of the machine. The IV will be removed. The procedure may vary among health care providers and hospitals. What can I expect after the procedure? After your procedure, it is common to have: A metallic taste in your mouth from the contrast dye. A feeling of warmth. A headache from the nitroglycerin. Follow these instructions at home: Take over-the-counter and prescription medicines only as told by your health care provider. If you are told, drink enough fluid to keep your urine pale yellow. This will help to flush the contrast dye out of your body. Most people can  return to their normal activities right after the procedure. Ask your health care provider what activities are safe for you. It is up to you to get the results of your procedure. Ask your health care provider, or the department that is doing the procedure, when your results will be ready. Keep all follow-up visits as told by your health care provider. This is important. Contact a health care provider if: You have any symptoms of allergy to the contrast dye. These include: Shortness of breath. Rash or hives. A racing heartbeat. Summary A cardiac CT angiogram is a procedure to look at the heart and the area around the heart. It may be done to help find the cause of chest pains or other symptoms of heart disease. During this procedure, a large X-ray machine, called a CT scanner, takes detailed pictures of the heart and the surrounding area after a contrast dye has been injected into blood vessels in the  area. Ask your health care provider about changing or stopping your regular medicines before the procedure. This is especially important if you are taking diabetes medicines, blood thinners, or medicines to treat erectile dysfunction. If you are told, drink enough fluid to keep your urine pale yellow. This will help to flush the contrast dye out of your body. This information is not intended to replace advice given to you by your health care provider. Make sure you discuss any questions you have with your health care provider. Document Revised: 09/10/2020 Document Reviewed: 08/23/2018 Elsevier Patient Education  2022 [employer].

## 2021-01-23 NOTE — Assessment & Plan Note (Signed)
Will keep working on weight loss and trying to get her Ozempic.

## 2021-01-23 NOTE — Assessment & Plan Note (Signed)
Septum was 2 cm on MRI.  She had short runs of NSVT and some PVCs on her monitor.  She has no family history of sudden cardiac death and no significant outflow tract obstruction.  Overall her risk is low and her age is 89.  I do not recommend ICD at this time.  We will hold her metoprolol for few days given that she is having fatigue.  If her fatigue improves we will need to try calcium channel blocker instead.

## 2021-01-23 NOTE — Assessment & Plan Note (Addendum)
She continues to struggle with weight gain.  Referral was placed to healthy weight and wellness but she is on the wait list.  We did try to get her on Ozempic.  She did have prediabetes on her last hemoglobin A1c.  We will repeat today.  Given this, she should be able to get the Ozempic.  Continue working on try to increase exercise and limit carbohydrate intake.

## 2021-01-23 NOTE — Assessment & Plan Note (Signed)
Blood pressure slightly above goal of less than 130/80.  She is not getting any exercise.  She is so close that I suspect with exercise and weight loss she would be at goal.  Continue hydrochlorothiazide for now.  She will start working on going for a walk when she gets home from her shift before going to bed.

## 2021-01-26 ENCOUNTER — Encounter (HOSPITAL_BASED_OUTPATIENT_CLINIC_OR_DEPARTMENT_OTHER): Payer: Self-pay

## 2021-01-27 ENCOUNTER — Telehealth: Payer: Self-pay | Admitting: Student-PharmD

## 2021-01-27 MED ORDER — SEMAGLUTIDE-WEIGHT MANAGEMENT 0.25 MG/0.5ML ~~LOC~~ SOAJ: 0.2500 mg | SUBCUTANEOUS | 0 refills | Status: AC

## 2021-01-27 MED ORDER — SEMAGLUTIDE-WEIGHT MANAGEMENT 1.7 MG/0.75ML ~~LOC~~ SOAJ: 1.7000 mg | SUBCUTANEOUS | 0 refills | Status: AC

## 2021-01-27 MED ORDER — SEMAGLUTIDE-WEIGHT MANAGEMENT 0.5 MG/0.5ML ~~LOC~~ SOAJ: 0.5000 mg | SUBCUTANEOUS | 0 refills | Status: AC

## 2021-01-27 MED ORDER — SEMAGLUTIDE-WEIGHT MANAGEMENT 1 MG/0.5ML ~~LOC~~ SOAJ: 1.0000 mg | SUBCUTANEOUS | 0 refills | Status: AC

## 2021-01-27 MED ORDER — SEMAGLUTIDE-WEIGHT MANAGEMENT 2.4 MG/0.75ML ~~LOC~~ SOAJ: 2.4000 mg | SUBCUTANEOUS | 11 refills | Status: DC

## 2021-01-27 NOTE — Telephone Encounter (Signed)
Received referral from Dr. Oval Linsey to start semaglutide for this patient for weight loss. They meet FDA approved criteria for semaglutide for use in obesity given BMI 39.25. Obesity is complicated by chronic conditions including HTN, prediabetes. No contraindications seen in the chart to [facility] use. She was previously interested in [place] but was unable to get coverage.   Submitted PA for [employer] to insurance and was approved through 08/25/21. This has been sent in to her pharmacy for the full dose titration.   Called patient to discuss starting [facility] but was unable to reach. LVM requesting call back.   She has an A1c and lipid panel ordered but not sure when she is planning to have this drawn. Would not need further baseline labs. Will need to schedule first injection appt with [facility] pharmacists.

## 2021-02-03 NOTE — Telephone Encounter (Signed)
Patient returned call. Is interested in starting [facility]. Discussed administration and possible adverse effects and confirmed no family history of thyroid cancer. Scheduled her for 1st appt on 02/25/21 with [facility] pharmacists.

## 2021-02-17 ENCOUNTER — Encounter (HOSPITAL_COMMUNITY): Payer: Self-pay

## 2021-02-25 ENCOUNTER — Ambulatory Visit (INDEPENDENT_AMBULATORY_CARE_PROVIDER_SITE_OTHER): Payer: BC Managed Care – PPO | Admitting: Pharmacist

## 2021-02-25 ENCOUNTER — Other Ambulatory Visit: Payer: Self-pay

## 2021-02-25 VITALS — BP 142/90 | HR 65 | Ht 67.0 in | Wt 249.8 lb

## 2021-02-25 DIAGNOSIS — I10 Essential (primary) hypertension: Secondary | ICD-10-CM

## 2021-02-25 DIAGNOSIS — E669 Obesity, unspecified: Secondary | ICD-10-CM | POA: Diagnosis not present

## 2021-02-25 DIAGNOSIS — R7303 Prediabetes: Secondary | ICD-10-CM

## 2021-02-25 DIAGNOSIS — Z5181 Encounter for therapeutic drug level monitoring: Secondary | ICD-10-CM | POA: Diagnosis not present

## 2021-02-25 NOTE — Progress Notes (Signed)
Patient ID: KENIJAH BENNINGFIELD                 DOB: 02/28/1965                    MRN: [id number]     HPI: Judy [name] is a 56 y.o. female patient referred to pharmacy clinic by Dr Oval Linsey to initiate weight loss therapy with GLP1-RA. PMH is significant for obesity, HOCM, HTN, and prediabetes. Coronary CTA was ordered at last visit in January, has not been scheduled yet. Most recent BMI 39.  Pt presents today in good spirits. Reports she started on her Wegovy injections when rx was sent in last month. Just gave her 4th dose of the 0.43m. Has been tolerating medication well so far without adverse effects. Has not noticed a change in her appetite yet. Usually eats only once a day, works 3rd shift. Has been trying to walk more. [facility] through [facility]. Reports energy level has been better since she held metoprolol tartrate 12.527mBID last month.  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 Brings fruit as snack to work. Likes omelettes, grits or toast, meat and veggie, pasta.  Drinks water, OJ.  Exercise: Started walking 20 mins every other day  Family History: The patient's family history includes Diabetes in her father; Heart attack in her sister; Heart disease in her mother.   Social History: The patient  reports that she has never smoked. She has never used smokeless tobacco. She reports current alcohol use. She reports that she does not use drugs.   Labs: Lab Results  Component Value Date   HGBA1C 5.8 (H) 08/03/2017    Wt Readings from Last 1 Encounters:  01/23/21 250 lb 9.6 oz (113.7 kg)    BP Readings from Last 1 Encounters:  01/23/21 134/82   Pulse Readings from Last 1 Encounters:  01/23/21 61       Component Value Date/Time    CHOL 203 (H) 08/03/2017 1720   TRIG 74 08/03/2017 1720   HDL 57 08/03/2017 1720   CHOLHDL 3.6 08/03/2017 1720   VLDL 26 02/04/2017 0301   LDLCALC 129 (H) 08/03/2017 1720    Past Medical History:  Diagnosis Date   Essential hypertension 06/14/2019   [place] ([place]rophic obstructive [place]) [place]25[place]   Hypertension    Obesity (BMI 30-39.9) 08/21/2019   Pre-diabetes 01/23/2021    Current Outpatient Medications on File Prior to Visit  Medication Sig Dispense Refill   hydrochlorothiazide (HYDRODIURIL) 25 MG tablet Take 25 mg by mouth every morning.     metoprolol tartrate (LOPRESSOR) 25 MG tablet TAKE 1/2 TABLET BY MOUTH 2 TIMES DAILY 90 tablet 1   potassium chloride (KLOR-CON) 10 MEQ tablet Take 10 mEq by mouth every other day.     Semaglutide-Weight Management 0.5 MG/0.5ML SOAJ Inject 0.5 mg into the skin once a week for 28 days. 2 mL 0   [START ON 03/26/2021] Semaglutide-Weight Management 1 MG/0.5ML SOAJ Inject 1 mg into the skin once a week for 28 days. 2 mL 0   [START ON 04/24/2021] Semaglutide-Weight Management 1.7 MG/0.75ML SOAJ Inject 1.7 mg into the skin once a week for 28 days. 3 mL 0   [START ON 05/23/2021] Semaglutide-Weight Management 2.4 MG/0.75ML SOAJ Inject  2.4 mg into the skin once a week. 3 mL 11   No current facility-administered medications on file prior to visit.    Allergies  Allergen Reactions   Naproxen Sodium Hives    ANAPROX     Assessment/Plan:  1. Weight loss - Patient has not met goal of at least 5% of body weight loss with comprehensive lifestyle modifications alone in the past 3-6 months. Pharmacotherapy is appropriate to pursue as augmentation. Will start Wegovy dose titration as detailed below.  Advised patient on common side effects including nausea, diarrhea, dyspepsia, decreased appetite, and fatigue. Counseled patient on reducing meal size and how to titrate medication to minimize side effects. Counseled patient to call if intolerable  side effects. Patient will adhere to dietary modifications and will target at least 150 minutes of moderate intensity exercise weekly.   Titration Plan:  Will plan to follow the titration plan as below, pending patient is tolerating each dose before increasing to the next. Can slow titration if needed for tolerability.    -Month 2: Inject Wegovy 0.32m SQ once weekly x 4 weeks -Month 3: Inject Wegovy 146mSQ once weekly x 4 weeks -Month 4: Inject Wegovy 1.37m59mQ once weekly x 4 weeks -Month 5: Inject Wegovy 2.4mg29m once weekly  Pt has follow up in 1 month with APP at [facility] 6 month Wegovy follow up with PharmD as well.  Shaney Deckman E. Elisah Parmer, PharmD, BCACP, CPP [id number] [phone]: ([phone]: [phone]/2023 9:30 AM

## 2021-02-25 NOTE — Patient Instructions (Addendum)
GN[facility] Counseling Points This medication reduces your appetite and may make you feel fuller longer.  Stop eating when your body tells you that you are full. This will likely happen sooner than you are used to. Store your medication in the fridge until you are ready to use it. Inject your medication in the fatty tissue of your lower abdominal area (2 inches away from belly button) or upper outer thigh. Rotate injection sites. Common side effects include: nausea, diarrhea/constipation, and heartburn, and are more likely to occur if you overeat.  Dosing schedule: -Month 1: Inject Wegovy 0.25mg  SQ once weekly x 4 weeks -Month 2: Inject HYQMVH 0.5mg  SQ once weekly x 4 weeks -Month 3: Inject Wegovy 1mg  SQ once weekly x 4 weeks -Month 4: Inject QIONGE 1.7mg  SQ once weekly x 4 weeks -Month 5: Inject Wegovy 2.4mg  SQ once weekly  Call clinic with any concerns before your next follow up appt.  Tips for living a healthier life     Building a Healthy and Balanced Diet Make most of your meal vegetables and fruits -  of your plate. Aim for color and variety, and remember that potatoes dont count as vegetables on the Healthy Eating Plate because of their negative impact on blood sugar.  Go for whole grains -  of your plate. Whole and intact grains--whole wheat, barley, wheat berries, quinoa, oats, brown rice, and foods made with them, such as whole wheat pasta--have a milder effect on blood sugar and insulin than white bread, white rice, and other refined grains.  Protein power -  of your plate. Fish, poultry, beans, and nuts are all healthy, versatile protein sources--they can be mixed into salads, and pair well with vegetables on a plate. Limit red meat, and avoid processed meats such as bacon and sausage.  Healthy plant oils - in moderation. Choose healthy vegetable oils like olive, canola, soy, corn, sunflower, peanut, and others, and avoid partially hydrogenated oils, which contain unhealthy  trans fats. Remember that low-fat does not mean healthy.  Drink water, coffee, or tea. Skip sugary drinks, limit milk and dairy products to one to two servings per day, and limit juice to a small glass per day.  Stay active. The red figure running across the [place] is a reminder that staying active is also important in weight control.  The main message of the Healthy Eating Plate is to focus on diet quality:  The type of carbohydrate in the diet is more important than the amount of carbohydrate in the diet, because some sources of carbohydrate--like vegetables (other than potatoes), fruits, whole grains, and beans--are healthier than others. The Healthy Eating Plate also advises consumers to avoid sugary beverages, a major source of calories--usually with little nutritional value--in the American diet. The Healthy Eating Plate encourages consumers to use healthy oils, and it does not set a maximum on the percentage of calories people should get each day from healthy sources of fat. In this way, the Healthy Eating Plate recommends the opposite of the low-fat message promoted for decades by the USDA.  DeskDistributor.no  SUGAR  Sugar is a huge problem in the modern day diet. Sugar is a big contributor to heart disease, diabetes, high triglyceride levels, fatty liver disease and obesity. Sugar is hidden in almost all packaged foods/beverages. Added sugar is extra sugar that is added beyond what is naturally found and has no nutritional benefit for your body. The American Heart Association recommends limiting added sugars to no more than  25g for women and 36 grams for men per day. There are many names for sugar including maltose, sucrose (names ending in "ose"), high fructose corn syrup, molasses, cane sugar, corn sweetener, raw sugar, syrup, honey or fruit juice concentrate.   One of the best ways to limit your added sugars  is to stop drinking sweetened beverages such as soda, sweet tea, and fruit juice.  There is 65g of added sugars in one 20oz bottle of Coke! That is equal to 7.5 donuts.   Pay attention and read all nutrition facts labels. Below is an examples of a nutrition facts label. The #1 is showing you the total sugars where the # 2 is showing you the added sugars. This one serving has almost the max amount of added sugars per day!     20 oz Soda 65g Sugar = 7.5 Glazed Donuts  16oz Energy  Drink 54g Sugar = 6.5 Glazed Donuts  Large Sweet  Tea 38g Sugar = 4 Glazed Donuts  20oz Sports  Drink 34g Sugar = 3.5 Glazed Donuts  8oz Chocolate Milk 24g Sugar =2.5 Glazed Donuts  8oz Orange  Juice 21g Sugar = 2 Glazed Donuts  1 Juice Box 14g Sugar = 1.5 Glazed Donuts  16oz Water= NO SUGAR!!  EXERCISE  Exercise is good. Weve all heard that. In an ideal world, we would all have time and resources to get plenty of it. When you are active, your heart pumps more efficiently and you will feel better.  Multiple studies show that even walking regularly has benefits that include living a longer life. The American Heart Association recommends 150 minutes per week of exercise (30 minutes per day most days of the week). You can do this in any increment you wish. Nine or more 10-minute walks count. So does an hour-long exercise class. Break the time apart into what will work in your life. Some of the best things you can do include walking briskly, jogging, cycling or swimming laps. Not everyone is ready to exercise. Sometimes we need to start with just getting active. Here are some easy ways to be more active throughout the day:  Take the stairs instead of the elevator  Go for a 10-15 minute walk during your lunch break (find a friend to make it more enjoyable)  When shopping, park at the back of the parking lot  If you take public transportation, get off one stop early and walk the extra distance  Pace  around while making phone calls  Check with your doctor if you arent sure what your limitations may be. Always remember to drink plenty of water when doing any type of exercise. Dont feel like a failure if youre not getting the 90-150 minutes per week. If you started by being a couch potato, then just a 10-minute walk each day is a huge improvement. Start with little victories and work your way up.   HEALTHY EATING TIPS  When looking to improve your eating habits, whether to lose weight, lower blood pressure or just be healthier, it helps to know what a serving size is.   Grains 1 slice of bread,  bagel,  cup pasta or rice  Vegetables 1 cup fresh or raw vegetables,  cup cooked or canned Fruits 1 piece of medium sized fruit,  cup canned,   Meats/Proteins  cup dried       1 oz meat, 1 egg,  cup cooked beans, nuts or seeds  Dairy  Fats Individual yogurt container, 1 cup (8oz)    1 teaspoon margarine/butter or vegetable  milk or milk alternative, 1 slice of cheese          oil; 1 tablespoon mayonnaise or salad dressing                  Plan ahead: make a menu of the meals for a week then create a grocery list to go with that menu. Consider meals that easily stretch into a night of leftovers, such as stews or casseroles. Or consider making two of your favorite meal and put one in the freezer for another night. Try a night or two each week that is meatless or no cook such as salads. When you get home from the grocery store wash and prepare your vegetables and fruits. Then when you need them they are ready to go.   Tips for going to the grocery store:  [place] store or generic brands  Check the weekly ad from your store on-line or in their in-store flyer  Look at the unit price on the shelf tag to compare/contrast the costs of different items  Buy fruits/vegetables in season  Carrots, bananas and apples are low-cost, naturally healthy items  If meats or frozen vegetables are on  sale, buy some extras and put in your freezer  Limit buying prepared or ready to eat items, even if they are pre-made salads or fruit snacks  Do not shop when youre hungry  Foods at eye level tend to be more expensive. Look on the high and low shelves for deals.  Consider shopping at the farmers market for fresh foods in season.  Avoid the cookie and chip aisles (these are expensive, high in calories and low in nutritional value). Shop on the outside of the grocery store.  Healthy food preparations:  If you cant get lean hamburger, be sure to drain the fat when cooking  Steam, saut (in olive oil), grill or bake foods  Experiment with different seasonings to avoid adding salt to your foods. Kosher salt, sea salt and Himalayan salt are all still salt and should be avoided. Try seasoning food with onion, garlic, thyme, rosemary, basil ect. Onion powder or garlic powder is ok. Avoid if it says salt (ie garlic salt).

## 2021-02-26 LAB — CBC WITH DIFFERENTIAL/PLATELET
Basophils Absolute: 0 10*3/uL (ref 0.0–0.2)
Basos: 0 %
EOS (ABSOLUTE): 0.2 10*3/uL (ref 0.0–0.4)
Eos: 2 %
Hematocrit: 39 % (ref 34.0–46.6)
Hemoglobin: 13.1 g/dL (ref 11.1–15.9)
Immature Grans (Abs): 0 10*3/uL (ref 0.0–0.1)
Immature Granulocytes: 0 %
Lymphocytes Absolute: 4 10*3/uL — ABNORMAL HIGH (ref 0.7–3.1)
Lymphs: 43 %
MCH: 31.3 pg (ref 26.6–33.0)
MCHC: 33.6 g/dL (ref 31.5–35.7)
MCV: 93 fL (ref 79–97)
Monocytes Absolute: 0.5 10*3/uL (ref 0.1–0.9)
Monocytes: 5 %
Neutrophils Absolute: 4.7 10*3/uL (ref 1.4–7.0)
Neutrophils: 50 %
Platelets: 315 10*3/uL (ref 150–450)
RBC: 4.19 x10E6/uL (ref 3.77–5.28)
RDW: 13 % (ref 11.7–15.4)
WBC: 9.4 10*3/uL (ref 3.4–10.8)

## 2021-02-26 LAB — LIPID PANEL
Chol/HDL Ratio: 3.6 ratio (ref 0.0–4.4)
Cholesterol, Total: 195 mg/dL (ref 100–199)
HDL: 54 mg/dL (ref >39)
LDL Chol Calc (NIH): 127 mg/dL — ABNORMAL HIGH (ref 0–99)
Triglycerides: 74 mg/dL (ref 0–149)
VLDL Cholesterol Cal: 14 mg/dL (ref 5–40)

## 2021-02-26 LAB — COMPREHENSIVE METABOLIC PANEL
ALT: 15 IU/L (ref 0–32)
AST: 23 IU/L (ref 0–40)
Albumin/Globulin Ratio: 1.4 (ref 1.2–2.2)
Albumin: 4.5 g/dL (ref 3.8–4.9)
Alkaline Phosphatase: 93 IU/L (ref 44–121)
BUN/Creatinine Ratio: 15 (ref 9–23)
BUN: 13 mg/dL (ref 6–24)
Bilirubin Total: 0.4 mg/dL (ref 0.0–1.2)
CO2: 29 mmol/L (ref 20–29)
Calcium: 9.8 mg/dL (ref 8.7–10.2)
Chloride: 97 mmol/L (ref 96–106)
Creatinine, Ser: 0.89 mg/dL (ref 0.57–1.00)
Globulin, Total: 3.2 g/dL (ref 1.5–4.5)
Glucose: 80 mg/dL (ref 70–99)
Potassium: 3.5 mmol/L (ref 3.5–5.2)
Sodium: 138 mmol/L (ref 134–144)
Total Protein: 7.7 g/dL (ref 6.0–8.5)
eGFR: 77 mL/min/{1.73_m2} (ref >59)

## 2021-02-26 LAB — MAGNESIUM: Magnesium: 2 mg/dL (ref 1.6–2.3)

## 2021-02-26 LAB — HEMOGLOBIN A1C
Est. average glucose Bld gHb Est-mCnc: 123 mg/dL
Hgb A1c MFr Bld: 5.9 % — ABNORMAL HIGH (ref 4.8–5.6)

## 2021-02-26 LAB — TSH: TSH: 1.79 u[IU]/mL (ref 0.450–4.500)

## 2021-03-26 NOTE — Progress Notes (Deleted)
? ? ?Office Visit  ?  ?Patient Name: Judy [name] ?Date of Encounter: 03/26/2021 ? ?Primary Care Provider:  Antonietta Jewel, MD ?Primary Cardiologist:  Skeet Latch, MD ?Primary Electrophysiologist: None ?Chief Complaint  ?  ?Judy [name] is a 56 y.o. female presents today for 7-monthfollow-up ? ?History of Present Illness  ?  ?Judy DEROSAis a 56y.o. female with PMH of hypertrophic cardiomyopathy, HTN, prediabetes, obesity.  Patient presented originally 1/19 for chest pain, troponins were negative, EKG concerning for hypertrophic cardiomyopathy.  Echo completed 1/19 with LVEF 65-70%, increased wall thickness, asymmetric hypertrophy of septum, dilated left atrium, mitral regurgitation. Stress echo did not show significant ischemia she had a short run of NSVT during hospitalization and she was started on metoprolol.  She was seen in follow-up by LKerin Ransom PA and echo performed cardiac MRI completed 9/21 with asymmetrical LV hypertrophy up to 20 mm in the mid inferior septum, 6 mm in posterior wall.  Repeat echo on 6/21 with LVEF 60-65%, grade 1 DD, asymmetrical septal hypertrophy. ? ?Patient had complaints of dyspnea on exertion at 9/22 appointment.  3-day ZIO event monitor was worn revealing 4 beats of NSVT, and 8 beats of SVT.  She had also noted some chest pain with walking and tiredness.  Her metoprolol was held for few days to see if her tiredness would resolve. ? ? ?Since{Blank single:19197::"last being seen in our clinic","discharge from hospital"} the patient reports doing ***.  '@He'$ /She@ denies chest pain, palpitations, dyspnea, PND, orthopnea, nausea, vomiting, dizziness, syncope, edema, weight gain, or early satiety. ?Notes: ?Blood pressure goal 130/80 ?Calcium channel blocker if metoprolol decreased fatigue ?Patient currently on Wegovy ?Past Medical History  ?  ?Past Medical History:  ?Diagnosis Date  ? Essential hypertension 06/14/2019  ? H[place] (hypertrophic obstructive [place]) [place]  02[place]04/[place]  ? Hypertension   ? Obesity (BMI 30-39.9) 08/21/2019  ? Pre-diabetes 01/23/2021  ? ?Past Surgical History:  ?Procedure Laterality Date  ? BARTHOLIN CYST MARSUPIALIZATION    ? CESAREAN SECTION    ? X3 with BTL  ? DILATATION & CURETTAGE/HYSTEROSCOPY WITH TRUECLEAR N/A 04/12/2013  ? Procedure: DILATATION & CURETTAGE/HYSTEROSCOPY ;  Surgeon: TAnastasio Auerbach MD;  [facility];  Service: Gynecology;  Laterality: N/A;  ? TUBAL LIGATION    ? ? ?Allergies ? ?Allergies  ?Allergen Reactions  ? Naproxen Sodium Hives  ?  ANAPROX  ? ? ?Home Medications  ?  ?Current Outpatient Medications  ?Medication Sig Dispense Refill  ? potassium chloride (KLOR-CON) 10 MEQ tablet Take 10 mEq by mouth every other day.    ? Semaglutide-Weight Management 1 MG/0.5ML SOAJ Inject 1 mg into the skin once a week for 28 days. 2 mL 0  ? [START ON 04/24/2021] Semaglutide-Weight Management 1.7 MG/0.75ML SOAJ Inject 1.7 mg into the skin once a week for 28 days. 3 mL 0  ? [START ON 05/23/2021] Semaglutide-Weight Management 2.4 MG/0.75ML SOAJ Inject 2.4 mg into the skin once a week. 3 mL 11  ? ?No current facility-administered medications for this visit.  ?  ? ?Review of Systems  ?Please see the history of present illness.    ?(+)*** ?(+)*** ? ?All other systems reviewed and are otherwise negative except as noted above. ? ?Physical Exam  ?  ?Wt Readings from Last 3 Encounters:  ?02/25/21 249 lb 12.8 oz (113.3 kg)  ?01/23/21 250 lb 9.6 oz (113.7 kg)  ?09/23/20 245 lb 4.8 oz (111.3 kg)  ? ?VHY:QMVHQwere no vitals filed for this  visit.,There is no height or weight on file to calculate BMI. ? ?GEN: Well nourished, well developed, in no acute distress. ?Neck: Supple, no JVD, carotid bruits, or masses. ?Cardiac: S1,S2,***RRR, no murmurs, rubs, or gallops. No clubbing, cyanosis, edema.  ?Radials/PT 2+ and equal bilaterally.  ?Respiratory:  ***Respirations regular and unlabored, clear to auscultation bilaterally. ?MS: no deformity or atrophy. ?Skin: warm  and dry, no rash. ?Neuro:  Strength and sensation are intact. ?Psych: Normal affect. ? ?EKG/LABS/Other Studies Reviewed  ?  ?ECG personally reviewed by me today - ***  with rate of ***- no acute changes. ? ?Risk Assessment/Calculations:   ?{Does this patient have ATRIAL FIBRILLATION?:[phone]} ? ?    ? ? ?Lab Results  ?Component Value Date  ? WBC 9.4 02/25/2021  ? HGB 13.1 02/25/2021  ? HCT 39.0 02/25/2021  ? MCV 93 02/25/2021  ? PLT 315 02/25/2021  ? ?Lab Results  ?Component Value Date  ? CREATININE 0.89 02/25/2021  ? BUN 13 02/25/2021  ? NA 138 02/25/2021  ? K 3.5 02/25/2021  ? CL 97 02/25/2021  ? CO2 29 02/25/2021  ? ?Lab Results  ?Component Value Date  ? ALT 15 02/25/2021  ? AST 23 02/25/2021  ? ALKPHOS 93 02/25/2021  ? BILITOT 0.4 02/25/2021  ? ?Lab Results  ?Component Value Date  ? CHOL 195 02/25/2021  ? HDL 54 02/25/2021  ? LDLCALC 127 (H) 02/25/2021  ? TRIG 74 02/25/2021  ? CHOLHDL 3.6 02/25/2021  ?  ?Lab Results  ?Component Value Date  ? HGBA1C 5.9 (H) 02/25/2021  ? ? ?Assessment & Plan  ?  ?1.  HOCM: ?-No significant LVOT obstruction per cardiac MRI. ?-Septum was 2 cm on MRI, ? ?2.  Essential hypertension: ?-Blood pressure today is well controlled at ?-Calcium channel blocker ?-Fatigue decreased since holding metoprolol? ? ?3.  Obesity: ?-Continue to watch diet and maintain physical activity.-She is currently on Wegovy and is tolerating well without side effects. ? ? ?   ?Disposition: Follow-up with Skeet Latch, MD or APP in *** months ?{Are you ordering a CV Procedure (e.g. stress test, cath, DCCV, TEE, etc)?   Press F2        :[id number]}  ? ?Medication Adjustments/Labs and Tests Ordered: ?Current medicines are reviewed at length with the patient today.  Concerns regarding medicines are outlined above.  ?Tests Ordered: ?No orders of the defined types were placed in this encounter. ? ?Medication Changes: ?No orders of the defined types were placed in this encounter. ? ? ?Signed, ?Mable Fill, Marissa Nestle, NP ?03/26/2021, 11:23 AM ?[place] ?

## 2021-03-27 ENCOUNTER — Ambulatory Visit (HOSPITAL_BASED_OUTPATIENT_CLINIC_OR_DEPARTMENT_OTHER): Payer: BC Managed Care – PPO | Admitting: Family

## 2021-04-14 NOTE — Progress Notes (Deleted)
? ? ?Office Visit  ?  ?Patient Name: Judy [name] ?Date of Encounter: 04/14/2021 ? ?Primary Care Provider:  Antonietta Jewel, MD ?Primary Cardiologist:  Skeet Latch, MD ?Primary Electrophysiologist: None ?Chief Complaint  ?  ?57-monthfollow-up for hypertension ? ? Patient Profile: ?HTN ?HOCM ?Obesity ?Prediabetes ? ? Recent Studies: ?09/2019 cardiac MRI: Asymmetric LV hypertrophy, 20 mm in the mid inferior septum (6 mm in posterior wall) mild MR regurg, mild tricuspid regurg, ?06/2019 TTE: EF 65%, grade 1 DD, mild asymmetric septal hypertrophy, mild MR, mild LAE, no LVOT gradient at rest ?09/2020 two 3-day ZIO monitor: Indicated 4 beats of NSVT, 8 beats of SVT, rare PVCs ? ?History of Present Illness  ?  ?Judy INSCOis a 56y.o. female with PMH of hypertrophic cardiomyopathy, HTN, prediabetes, obesity.  Patient presented originally 1/19 for chest pain, troponins were negative, EKG concerning for hypertrophic cardiomyopathy.  Echo completed 1/19 with LVEF 65-70%, increased wall thickness, asymmetric hypertrophy of septum, dilated left atrium, mitral regurgitation. Stress echo did not show significant ischemia she had a short run of NSVT during hospitalization and she was started on metoprolol.  She was seen in follow-up by LKerin Ransom PA and echo performed cardiac MRI completed 9/21 with asymmetrical LV hypertrophy up to 20 mm in the mid inferior septum, 6 mm in posterior wall.  Repeat echo on 6/21 with LVEF 60-65%, grade 1 DD, asymmetrical septal hypertrophy. ?  ?Patient had complaints of dyspnea on exertion at 9/22 appointment.  3-day ZIO event monitor was worn revealing 4 beats of NSVT, and 8 beats of SVT.  She had also noted some chest pain with walking and tiredness.  Her metoprolol was held for few days to see if her tiredness would resolve. ? ? ?Since {Blank single:19197::"last being seen in our clinic","discharge from hospital"} the patient reports doing ***.  '@He'$ /She@ denies chest pain, palpitations,  dyspnea, PND, orthopnea, nausea, vomiting, dizziness, syncope, edema, weight gain, or early satiety. ? ?Notes: ?Blood pressure goal 130/80 ?Calcium channel blocker if metoprolol decreased fatigue ?Patient currently on Wegovy ? ? ? ?Past Medical History  ?  ?Past Medical History:  ?Diagnosis Date  ? Essential hypertension 06/14/2019  ? [place] [place]  ? Hypertension   ? Obesity (BMI 30-39.9) 08/21/2019  ? Pre-diabetes 01/23/2021  ? ?Past Surgical History:  ?Procedure Laterality Date  ? BARTHOLIN CYST MARSUPIALIZATION    ? CESAREAN SECTION    ? X3 with BTL  ? DILATATION & CURETTAGE/HYSTEROSCOPY WITH TRUECLEAR N/A 04/12/2013  ? Procedure: DILATATION & CURETTAGE/HYSTEROSCOPY ;  Surgeon: TAnastasio Auerbach MD;  [facility];  Service: Gynecology;  Laterality: N/A;  ? TUBAL LIGATION    ? ? ?Allergies ? ?Allergies  ?Allergen Reactions  ? Naproxen Sodium Hives  ?  ANAPROX  ? ? ?Home Medications  ?  ?Current Outpatient Medications  ?Medication Sig Dispense Refill  ? potassium chloride (KLOR-CON) 10 MEQ tablet Take 10 mEq by mouth every other day.    ? Semaglutide-Weight Management 1 MG/0.5ML SOAJ Inject 1 mg into the skin once a week for 28 days. 2 mL 0  ? [START ON 04/24/2021] Semaglutide-Weight Management 1.7 MG/0.75ML SOAJ Inject 1.7 mg into the skin once a week for 28 days. 3 mL 0  ? [START ON 05/23/2021] Semaglutide-Weight Management 2.4 MG/0.75ML SOAJ Inject 2.4 mg into the skin once a week. 3 mL 11  ? ?No current facility-administered medications for this visit.  ?  ? ?Review of Systems  ?Please  see the history of present illness.    ?(+)*** ?(+)*** ? ?All other systems reviewed and are otherwise negative except as noted above. ? ?Physical Exam  ?  ?Wt Readings from Last 3 Encounters:  ?02/25/21 249 lb 12.8 oz (113.3 kg)  ?01/23/21 250 lb 9.6 oz (113.7 kg)  ?09/23/20 245 lb 4.8 oz (111.3 kg)  ? ?OI:ZTIWP were no vitals filed for this visit.,There is no height or weight on  file to calculate BMI. ? ?Constitutional:   ?   Appearance: Healthy appearance. Not in distress.  ?Neck:  ?   Vascular: JVD normal.  ?Pulmonary:  ?   Effort: Pulmonary effort is normal.  ?   Breath sounds: No wheezing. No rales.  ?Cardiovascular:  ?   Normal rate. Regular rhythm. Normal S1. Normal S2.   ?   Murmurs: There is no murmur.  ?Edema: ?   Peripheral edema absent.  ?Abdominal:  ?   Palpations: Abdomen is soft. There is no hepatomegaly.  ?Skin: ?   General: Skin is warm and dry.  ?Neurological:  ?   General: No focal deficit present.  ?   Mental Status: Alert and oriented to person, place and time.  ?   Cranial Nerves: Cranial nerves are intact.  ?EKG/LABS/Other Studies Reviewed  ?  ?ECG personally reviewed by me today - ***  with rate of ***- no acute changes. ? ?Risk Assessment/Calculations:   ?{Does this patient have ATRIAL FIBRILLATION?:[phone]} ? ?    ? ? ?Lab Results  ?Component Value Date  ? WBC 9.4 02/25/2021  ? HGB 13.1 02/25/2021  ? HCT 39.0 02/25/2021  ? MCV 93 02/25/2021  ? PLT 315 02/25/2021  ? ?Lab Results  ?Component Value Date  ? CREATININE 0.89 02/25/2021  ? BUN 13 02/25/2021  ? NA 138 02/25/2021  ? K 3.5 02/25/2021  ? CL 97 02/25/2021  ? CO2 29 02/25/2021  ? ?Lab Results  ?Component Value Date  ? ALT 15 02/25/2021  ? AST 23 02/25/2021  ? ALKPHOS 93 02/25/2021  ? BILITOT 0.4 02/25/2021  ? ?Lab Results  ?Component Value Date  ? CHOL 195 02/25/2021  ? HDL 54 02/25/2021  ? LDLCALC 127 (H) 02/25/2021  ? TRIG 74 02/25/2021  ? CHOLHDL 3.6 02/25/2021  ?  ?Lab Results  ?Component Value Date  ? HGBA1C 5.9 (H) 02/25/2021  ? ? ?Assessment & Plan  ?  ?1.  HOCM: ?-No significant LVOT obstruction per cardiac MRI. ?-Septum was 2 cm on MRI, ?  ?2.  Essential hypertension: ?-Blood pressure today is well controlled at ?-Calcium channel blocker ?-Fatigue decreased since holding metoprolol? ?  ?3.  Obesity: ?-Continue to watch diet and maintain physical activity.-She is currently on Wegovy and is tolerating  well without side effects. ? ?   ?Disposition: Follow-up with Skeet Latch, MD or APP in *** months ?{Are you ordering a CV Procedure (e.g. stress test, cath, DCCV, TEE, etc)?   Press F2        :[id number]}  ? ?Medication Adjustments/Labs and Tests Ordered: ?Current medicines are reviewed at length with the patient today.  Concerns regarding medicines are outlined above.  ?Tests Ordered: ?No orders of the defined types were placed in this encounter. ? ?Medication Changes: ?No orders of the defined types were placed in this encounter. ? ? ?Signed, ?Mable Fill, Marissa Nestle, NP ?04/14/2021, 9:27 AM ?[place] ?

## 2021-04-15 DIAGNOSIS — R7303 Prediabetes: Secondary | ICD-10-CM | POA: Diagnosis not present

## 2021-04-15 DIAGNOSIS — I10 Essential (primary) hypertension: Secondary | ICD-10-CM | POA: Diagnosis not present

## 2021-04-15 DIAGNOSIS — E669 Obesity, unspecified: Secondary | ICD-10-CM | POA: Diagnosis not present

## 2021-04-16 ENCOUNTER — Ambulatory Visit (HOSPITAL_BASED_OUTPATIENT_CLINIC_OR_DEPARTMENT_OTHER): Payer: BC Managed Care – PPO | Admitting: Family

## 2021-04-16 LAB — HEMOGLOBIN A1C
Est. average glucose Bld gHb Est-mCnc: 117 mg/dL
Hgb A1c MFr Bld: 5.7 % — ABNORMAL HIGH (ref 4.8–5.6)

## 2021-04-16 LAB — COMPREHENSIVE METABOLIC PANEL
ALT: 14 IU/L (ref 0–32)
AST: 19 IU/L (ref 0–40)
Albumin/Globulin Ratio: 1.5 (ref 1.2–2.2)
Albumin: 4.4 g/dL (ref 3.8–4.9)
Alkaline Phosphatase: 81 IU/L (ref 44–121)
BUN/Creatinine Ratio: 9 (ref 9–23)
BUN: 9 mg/dL (ref 6–24)
Bilirubin Total: 0.4 mg/dL (ref 0.0–1.2)
CO2: 27 mmol/L (ref 20–29)
Calcium: 9.8 mg/dL (ref 8.7–10.2)
Chloride: 100 mmol/L (ref 96–106)
Creatinine, Ser: 0.95 mg/dL (ref 0.57–1.00)
Globulin, Total: 2.9 g/dL (ref 1.5–4.5)
Glucose: 86 mg/dL (ref 70–99)
Potassium: 3.8 mmol/L (ref 3.5–5.2)
Sodium: 141 mmol/L (ref 134–144)
Total Protein: 7.3 g/dL (ref 6.0–8.5)
eGFR: 71 mL/min/{1.73_m2} (ref >59)

## 2021-04-16 LAB — LIPID PANEL
Chol/HDL Ratio: 4.1 ratio (ref 0.0–4.4)
Cholesterol, Total: 185 mg/dL (ref 100–199)
HDL: 45 mg/dL (ref >39)
LDL Chol Calc (NIH): 123 mg/dL — ABNORMAL HIGH (ref 0–99)
Triglycerides: 91 mg/dL (ref 0–149)
VLDL Cholesterol Cal: 17 mg/dL (ref 5–40)

## 2021-04-16 LAB — CBC WITH DIFFERENTIAL/PLATELET
Basophils Absolute: 0 10*3/uL (ref 0.0–0.2)
Basos: 0 %
EOS (ABSOLUTE): 0.1 10*3/uL (ref 0.0–0.4)
Eos: 2 %
Hematocrit: 40.1 % (ref 34.0–46.6)
Hemoglobin: 13.3 g/dL (ref 11.1–15.9)
Immature Grans (Abs): 0 10*3/uL (ref 0.0–0.1)
Immature Granulocytes: 0 %
Lymphocytes Absolute: 3.4 10*3/uL — ABNORMAL HIGH (ref 0.7–3.1)
Lymphs: 44 %
MCH: 30.9 pg (ref 26.6–33.0)
MCHC: 33.2 g/dL (ref 31.5–35.7)
MCV: 93 fL (ref 79–97)
Monocytes Absolute: 0.4 10*3/uL (ref 0.1–0.9)
Monocytes: 5 %
Neutrophils Absolute: 3.8 10*3/uL (ref 1.4–7.0)
Neutrophils: 49 %
Platelets: 320 10*3/uL (ref 150–450)
RBC: 4.31 x10E6/uL (ref 3.77–5.28)
RDW: 13.1 % (ref 11.7–15.4)
WBC: 7.6 10*3/uL (ref 3.4–10.8)

## 2021-04-16 LAB — TSH: TSH: 1.78 u[IU]/mL (ref 0.450–4.500)

## 2021-04-21 NOTE — Progress Notes (Signed)
? ? ?Office Visit  ?  ?Patient Name: Judy [name] ?Date of Encounter: 04/21/2021 ? ?Primary Care Provider:  Antonietta Jewel, MD ?Primary Cardiologist:  Skeet Latch, MD ?Primary Electrophysiologist: None ?Chief Complaint  ? 84-monthfollow-up for hypertension ?  ? Patient Profile: ?HTN ?HOCM ?Obesity ?Prediabetes ?  ? Recent Studies: ?09/2019 cardiac MRI: Asymmetric LV hypertrophy, 20 mm in the mid inferior septum (6 mm in posterior wall) mild MR regurg, mild tricuspid regurg, ?06/2019 TTE: EF 65%, grade 1 DD, mild asymmetric septal hypertrophy, mild MR, mild LAE, no LVOT gradient at rest ?09/2020 two 3-day ZIO monitor: Indicated 4 beats of NSVT, 8 beats of SVT, rare PVCs ?She is not drinking ?History of Present Illness  ?  ?Judy [name] a 56y.o. female with PMH of hypertrophic cardiomyopathy, HTN, prediabetes, obesity.  Patient presented originally 1/19 for chest pain, troponins were negative, EKG concerning for hypertrophic cardiomyopathy.  Echo completed 1/19 with LVEF 65-70%, increased wall thickness, asymmetric hypertrophy of septum, dilated left atrium, mitral regurgitation. Stress echo did not show significant ischemia she had a short run of NSVT during hospitalization and she was started on metoprolol.  She was seen in follow-up by LKerin Ransom PA and echo performed cardiac MRI completed 9/21 with asymmetrical LV hypertrophy up to 20 mm in the mid inferior septum, 6 mm in posterior wall.  Repeat echo on 6/21 with LVEF 60-65%, grade 1 DD, asymmetrical septal hypertrophy. ?  ?Patient had complaints of dyspnea on exertion at 9/22 appointment.  3-day ZIO event monitor was worn revealing 4 beats of NSVT, and 8 beats of SVT.  She had also noted some chest pain with walking and tiredness.  Her metoprolol was held for few days to see if her tiredness would resolve.  Patient had follow-up with the pharmacy for initiation of Wegovy. ? ?Since last being seen in our clinic Ms. VBettenhausenstates that she is doing well.   Since stopping her metoprolol she reports that her tiredness has resolved completely.  Patient works third shift and states that she is missed phone calls from cardiac navigators regarding scheduling CTA.  She denies any palpitations and states that she does have occasional SOB while walking but only occurs when going up steep incline.  She was recently started on Wegovy and is tolerating medication well without any side effects.  She denies chest pain, palpitations, dyspnea, PND, orthopnea, nausea, vomiting, dizziness, syncope, edema, weight gain, or early satiety. ? ?Past Medical History  ?  ?Past Medical History:  ?Diagnosis Date  ? Essential hypertension 06/14/2019  ? H[place]M (hypertrophic [place] 02/04/[place]  ? Hypertension   ? Obesity (BMI 30-39.9) 08/21/2019  ? Pre-diabetes 01/23/2021  ? ?Past Surgical History:  ?Procedure Laterality Date  ? BARTHOLIN CYST MARSUPIALIZATION    ? CESAREAN SECTION    ? X3 with BTL  ? DILATATION & CURETTAGE/HYSTEROSCOPY WITH TRUECLEAR N/A 04/12/2013  ? Procedure: DILATATION & CURETTAGE/HYSTEROSCOPY ;  Surgeon: TAnastasio Auerbach MD;  [facility];  Service: Gynecology;  Laterality: N/A;  ? TUBAL LIGATION    ? ?Allergies ? ?Allergies  ?Allergen Reactions  ? Naproxen Sodium Hives  ?  ANAPROX  ? ? ?Home Medications  ?  ?Current Outpatient Medications  ?Medication Sig Dispense Refill  ? potassium chloride (KLOR-CON) 10 MEQ tablet Take 10 mEq by mouth every other day.    ? Semaglutide-Weight Management 1 MG/0.5ML SOAJ Inject 1 mg into the skin once a week for 28 days. 2 mL 0  ? [  START ON 04/24/2021] Semaglutide-Weight Management 1.7 MG/0.75ML SOAJ Inject 1.7 mg into the skin once a week for 28 days. 3 mL 0  ? [START ON 05/23/2021] Semaglutide-Weight Management 2.4 MG/0.75ML SOAJ Inject 2.4 mg into the skin once a week. 3 mL 11  ? ?No current facility-administered medications for this visit.  ?  ? ?Review of Systems  ?Please see the history of present illness.    ?(+)  Shortness of breath with exercise ? ?All other systems reviewed and are otherwise negative except as noted above. ? ?Physical Exam  ?  ?Wt Readings from Last 3 Encounters:  ?02/25/21 249 lb 12.8 oz (113.3 kg)  ?01/23/21 250 lb 9.6 oz (113.7 kg)  ?09/23/20 245 lb 4.8 oz (111.3 kg)  ? ?BS:WHQPR were no vitals filed for this visit.,There is no height or weight on file to calculate BMI. ? ?Constitutional:   ?   Appearance: Healthy appearance. Not in distress.  ?Neck:  ?   Vascular: JVD normal no carotid bruit on auscultation ?Pulmonary:  ?   Effort: Pulmonary effort is normal.  ?   Breath sounds: No wheezing. No rales.  ?Cardiovascular:  ?   Normal rate. Regular rhythm. Normal S1. Normal S2.   ?   Murmurs: Systolic noted at LUSB ?Edema: ?   Trace edema in lower extremities ?Abdominal:  ?   Palpations: Abdomen is soft. There is no hepatomegaly.  ?Skin: ?   General: Skin is warm and dry.  ?Neurological:  ?   General: No focal deficit present.  ?   Mental Status: Alert and oriented to person, place and time.  ?   Cranial Nerves: Cranial nerves are intact.  ?EKG/LABS/Other Studies Reviewed  ?  ?ECG personally reviewed by me today -sinus rhythm with left ventricular hypertrophy and slight prolonged QT interval.  Patient recent electrolytes within normal limits.  With rate of 70- no acute changes. ? ?Lab Results  ?Component Value Date  ? CREATININE 0.95 04/15/2021  ? BUN 9 04/15/2021  ? NA 141 04/15/2021  ? K 3.8 04/15/2021  ? CL 100 04/15/2021  ? CO2 27 04/15/2021  ? ?Lab Results  ?Component Value Date  ? ALT 14 04/15/2021  ? AST 19 04/15/2021  ? ALKPHOS 81 04/15/2021  ? BILITOT 0.4 04/15/2021  ? ?Lab Results  ?Component Value Date  ? CHOL 185 04/15/2021  ? HDL 45 04/15/2021  ? LDLCALC 123 (H) 04/15/2021  ? TRIG 91 04/15/2021  ? CHOLHDL 4.1 04/15/2021  ?  ?Lab Results  ?Component Value Date  ? HGBA1C 5.7 (H) 04/15/2021  ? ? ?Assessment & Plan  ?  ?1.  HOCM: ?-No significant LVOT obstruction per cardiac MRI. ?-Stable with no  exertional dyspnea ?-Septum was 2 cm on MRI, patient denies any palpitations or chest pain currently. ?-Patient works third shift and had missed previous phone calls for scheduling cardiac CTA.  She was provided phone number today to contact cardiac navigators to schedule cardiac CTA for evaluation of exertional dyspnea at her convenience. ?  ?2.  Essential hypertension: ?-Blood pressure today is well controlled at 126/82 ?-Fatigue has resolved completely since stopping metoprolol  ?-Discussed the importance of abstaining from salt and provided salty 6 sheet for hidden salt in foods. ?  ?3.  Obesity: ?-Continue to watch diet and maintain physical activity. ?-She is currently on Wegovy and is tolerating well without side effects. ? ?Disposition: Follow-up with Skeet Latch, MD or APP in 3 months months ? ?Medication Adjustments/Labs and Tests Ordered: ?Current  medicines are reviewed at length with the patient today.  Concerns regarding medicines are outlined above.  ?Tests Ordered: ?No orders of the defined types were placed in this encounter. ? ?Medication Changes: ?No orders of the defined types were placed in this encounter. ? ?Signed, ?Mable Fill, Marissa Nestle, NP ?04/21/2021, 7:12 PM ?[place] ?

## 2021-04-23 ENCOUNTER — Encounter (HOSPITAL_BASED_OUTPATIENT_CLINIC_OR_DEPARTMENT_OTHER): Payer: Self-pay | Admitting: Nurse Practitioner

## 2021-04-23 ENCOUNTER — Ambulatory Visit (INDEPENDENT_AMBULATORY_CARE_PROVIDER_SITE_OTHER): Payer: BC Managed Care – PPO | Admitting: Nurse Practitioner

## 2021-04-23 VITALS — BP 126/82 | HR 70 | Ht 67.0 in | Wt 244.5 lb

## 2021-04-23 DIAGNOSIS — I10 Essential (primary) hypertension: Secondary | ICD-10-CM

## 2021-04-23 DIAGNOSIS — I421 Obstructive hypertrophic cardiomyopathy: Secondary | ICD-10-CM

## 2021-04-23 DIAGNOSIS — E669 Obesity, unspecified: Secondary | ICD-10-CM | POA: Diagnosis not present

## 2021-04-23 NOTE — Patient Instructions (Signed)
Medication Instructions:  ?Your Physician recommend you continue on your current medication as directed.   ? ?*If you need a refill on your cardiac medications before your next appointment, please call your pharmacy* ? ?Testing/Procedures: ?Please call us at your earliest opportunity at [phone] to schedule your CT ? ? ?Follow-Up: ?At [facility], you and your health needs are our priority.  As part of our continuing mission to provide you with exceptional heart care, we have created designated Provider Care Teams.  These Care Teams include your primary Cardiologist (physician) and Advanced Practice Providers (APPs -  Physician Assistants and Nurse Practitioners) who all work together to provide you with the care you need, when you need it. ? ?We recommend signing up for the patient portal called "MyChart".  Sign up information is provided on this After Visit Summary.  MyChart is used to connect with patients for Virtual Visits (Telemedicine).  Patients are able to view lab/test results, encounter notes, upcoming appointments, etc.  Non-urgent messages can be sent to your provider as well.   ?To learn more about what you can do with MyChart, go to [email or web address].   ? ?Your next appointment:   ?3 month(s) ? ?The format for your next appointment:   ?In Person ? ?Provider:   ?Skeet Latch, MD or Laurann Montana, NP{ ? ?Other Instructions ? ? ? ?Important Information About Sugar ? ? ? ? ? ? ?

## 2021-05-28 ENCOUNTER — Telehealth (HOSPITAL_COMMUNITY): Payer: Self-pay | Admitting: *Deleted

## 2021-05-28 NOTE — Telephone Encounter (Signed)
Patient returning call regarding upcoming cardiac imaging study; pt verbalizes understanding of appt date/time, parking situation and where to check in, pre-test NPO status and medications ordered, and verified current allergies; name and call back number provided for further questions should they arise  Gordy Clement RN Navigator Cardiac Imaging [facility] [phone] office [phone] cell  Patient to take '25mg'$  metoprolol tartrate two hours prior to her cardiac CT scan.  She is aware to arrive at 9:00am.

## 2021-05-28 NOTE — Telephone Encounter (Signed)
Attempted to call patient regarding upcoming cardiac CT appointment. °Left message on voicemail with name and callback number ° °Yadira Hada RN Navigator Cardiac Imaging °[facility] °[phone] Office °[phone] Cell ° °

## 2021-06-01 ENCOUNTER — Ambulatory Visit (HOSPITAL_COMMUNITY)
Admission: RE | Admit: 2021-06-01 | Discharge: 2021-06-01 | Disposition: A | Discharge status: Home / Self Care | Payer: BC Managed Care – PPO | Source: Ambulatory Visit | Attending: Cardiovascular Disease | Admitting: Cardiovascular Disease

## 2021-06-01 DIAGNOSIS — I421 Obstructive hypertrophic cardiomyopathy: Secondary | ICD-10-CM

## 2021-06-01 DIAGNOSIS — R072 Precordial pain: Secondary | ICD-10-CM

## 2021-06-01 DIAGNOSIS — R7303 Prediabetes: Secondary | ICD-10-CM

## 2021-06-01 DIAGNOSIS — E669 Obesity, unspecified: Secondary | ICD-10-CM | POA: Diagnosis not present

## 2021-06-01 DIAGNOSIS — I10 Essential (primary) hypertension: Secondary | ICD-10-CM | POA: Diagnosis not present

## 2021-06-01 MED ORDER — NITROGLYCERIN 0.4 MG SL SUBL
SUBLINGUAL_TABLET | SUBLINGUAL | Status: AC
  Filled 2021-06-01: qty 2

## 2021-06-01 MED ORDER — NITROGLYCERIN 0.4 MG SL SUBL
0.8000 mg | SUBLINGUAL_TABLET | Freq: Once | SUBLINGUAL | Status: AC
  Administered 2021-06-01: 0.8 mg via SUBLINGUAL

## 2021-06-01 MED ORDER — IOHEXOL 350 MG/ML SOLN
100.0000 mL | Freq: Once | INTRAVENOUS | Status: AC | PRN
  Administered 2021-06-01: 100 mL via INTRAVENOUS

## 2021-07-21 ENCOUNTER — Ambulatory Visit (INDEPENDENT_AMBULATORY_CARE_PROVIDER_SITE_OTHER): Payer: BC Managed Care – PPO | Admitting: Pharmacist

## 2021-07-21 VITALS — BP 120/78 | HR 72 | Wt 241.0 lb

## 2021-07-21 DIAGNOSIS — R7303 Prediabetes: Secondary | ICD-10-CM | POA: Diagnosis not present

## 2021-07-21 DIAGNOSIS — E669 Obesity, unspecified: Secondary | ICD-10-CM | POA: Diagnosis not present

## 2021-07-21 MED ORDER — HYDROCHLOROTHIAZIDE 25 MG PO TABS: 25.0000 mg | ORAL_TABLET | Freq: Every day | ORAL | 3 refills | Status: DC

## 2021-07-21 NOTE — Progress Notes (Signed)
Patient ID: Judy [name]                 DOB: [birth year]                    MRN: [id number]     HPI: Judy [name] is a 56 y.o. female patient referred to pharmacy clinic by Dr Oval Linsey for GLP1-RA therapy. PMH is significant for obesity, HOCM, HTN, and prediabetes. Coronary CTA completed 05/2021 and showed minimal nonobstructive CAD with < 25% non-calcified plaque in the LCx/RCA.  Pt presents today for [facility] 6 month follow up. Wegovy PA approved through 08/25/21. Has been tolerating Wegovy well. Has had the 2.'4mg'$  dose filled twice. Denies GI side effects. Has been eating smaller portions more frequently during the day. Also hasn't craved sweets as much. Eating more fruits and vegetables. Trying to stay active with walking. Mentions that she restarted her metoprolol a few days ago (previously stopped due to fatigue), also back on HCTZ now. BP well controlled today.  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 Portions smaller, lots of fruits and vegetables  Exercise: Started walking 20 mins every other day  Family History: The patient's family history includes Diabetes in her father; Heart attack in her sister; Heart disease in her mother.   Social History: The patient  reports that she has never smoked. She has never used smokeless tobacco. She reports current alcohol use. She reports that she does not use drugs.   Labs: Lab Results  Component Value Date   HGBA1C 5.7 (H) 04/15/2021    Wt Readings from Last 1 Encounters:  04/23/21 244 lb 8 oz (110.9 kg)    BP Readings from Last 1 Encounters:  06/01/21 125/82   Pulse Readings from Last 1 Encounters:  04/23/21 70       Component Value Date/Time   CHOL 185 04/15/2021  1019   TRIG 91 04/15/2021 1019   HDL 45 04/15/2021 1019   CHOLHDL 4.1 04/15/2021 1019   CHOLHDL 3.6 08/03/2017 1720   VLDL 26 02/04/2017 0301   LDLCALC 123 (H) 04/15/2021 1019   LDLCALC 129 (H) 08/03/2017 1720    Past Medical History:  Diagnosis Date   Essential hypertension 06/14/2019   HOCM (hypertrophic obstructive cardiomyopathy) ([place]) 02/04/2017   Hypertension    Obesity (BMI 30-39.9) 08/21/2019   Pre-diabetes 01/23/2021    Current Outpatient Medications on File Prior to Visit  Medication Sig Dispense Refill   potassium chloride (KLOR-CON) 10 MEQ tablet Take 10 mEq by mouth every other day.     Semaglutide-Weight Management 2.4 MG/0.75ML SOAJ Inject 2.4 mg into the skin once a week. 3 mL 11   No current facility-administered medications on file prior to visit.    Allergies  Allergen Reactions   Naproxen Sodium Hives    ANAPROX     Assessment/Plan:  1. Weight loss - Pt will continue on maintenance dose of Wegovy 2.'4mg'$  SQ weekly. Has been tolerating med well, eating smaller portions and craving fewer sweets. Has lost 8 lbs on therapy, or ~3.2% of baseline weight. Will recheck A1c today as pt was previously prediabetic. F/u with PharmD as needed.  Nayelis Bonito E. Holten Spano, PharmD, BCACP, [place] [address]  [address], [place], [place] [address] Phone: [phone]; Fax: [phone] 07/21/2021 8:16 AM

## 2021-07-21 NOTE — Patient Instructions (Signed)
It was nice to see you today!  Your blood pressure is excellent  Continue taking your current medications  We'll check an updated A1c today

## 2021-07-22 LAB — HEMOGLOBIN A1C
Est. average glucose Bld gHb Est-mCnc: 108 mg/dL
Hgb A1c MFr Bld: 5.4 % (ref 4.8–5.6)

## 2021-07-23 ENCOUNTER — Ambulatory Visit (HOSPITAL_BASED_OUTPATIENT_CLINIC_OR_DEPARTMENT_OTHER): Payer: BC Managed Care – PPO | Admitting: Family

## 2021-07-23 ENCOUNTER — Other Ambulatory Visit: Payer: Self-pay | Admitting: Family

## 2021-07-31 ENCOUNTER — Telehealth: Payer: Self-pay | Admitting: Cardiovascular Disease

## 2021-07-31 NOTE — Telephone Encounter (Signed)
Would you her to try something else? Please advise

## 2021-07-31 NOTE — Telephone Encounter (Signed)
Pt c/o medication issue:  1. Name of Medication:  Wegovy  2. How are you currently taking this medication (dosage and times per day)?   3. Are you having a reaction (difficulty breathing--STAT)?   4. What is your medication issue?   Patient states her insurance will not cover [phone] and she would like to discuss alternatives or possibly patient assistance with purchasing. Please return call to discuss.

## 2021-08-12 NOTE — Telephone Encounter (Signed)
RN returned call to patient to provide the following recommendations, no answer, left message.    "Unfortunately we do not have other options if her insurance will cover it.  She is a per PCP can help."

## 2021-08-13 NOTE — Telephone Encounter (Signed)
Pt called and left message concerning her medication injection Semaglutide. Pt would like a call back concerning getting her medication. Please address

## 2021-08-14 NOTE — Telephone Encounter (Signed)
Called patient and LVM. There is no patient assistance for [name] available. Unfortunately most insurances wont pay for GLP-1 for pre-diabetes.  LVM for patient to call back.

## 2021-08-25 ENCOUNTER — Other Ambulatory Visit: Payer: Self-pay | Admitting: Family

## 2021-09-07 ENCOUNTER — Telehealth (HOSPITAL_BASED_OUTPATIENT_CLINIC_OR_DEPARTMENT_OTHER): Payer: Self-pay

## 2021-09-07 MED ORDER — HYDROCHLOROTHIAZIDE 25 MG PO TABS: 25.0000 mg | ORAL_TABLET | Freq: Every day | ORAL | 3 refills | Status: DC

## 2021-09-07 NOTE — Telephone Encounter (Signed)
[facility] from [facility] requesting refills of HCTZ 25 mg. Pt had refills sent to [place] for #90 with 3 refills and is switching this to mail order. Rx request sent to pharmacy.

## 2021-09-08 ENCOUNTER — Other Ambulatory Visit: Payer: Self-pay | Admitting: Cardiovascular Disease

## 2021-09-08 ENCOUNTER — Telehealth: Payer: Self-pay | Admitting: Pharmacist

## 2021-09-08 MED ORDER — SEMAGLUTIDE-WEIGHT MANAGEMENT 2.4 MG/0.75ML ~~LOC~~ SOAJ: 2.4000 mg | SUBCUTANEOUS | 11 refills | Status: DC

## 2021-09-08 MED ORDER — METOPROLOL TARTRATE 25 MG PO TABS: 12.5000 mg | ORAL_TABLET | Freq: Two times a day (BID) | ORAL | 1 refills | Status: DC

## 2021-09-08 NOTE — Telephone Encounter (Signed)
See telephone note from yesterday, 09/07/21---[facility].  Sent Metoprolol to [facility] today.  Routing to PharmD to assist with Ozempic refill.

## 2021-09-08 NOTE — Telephone Encounter (Signed)
PA for [facility] received.

## 2021-09-08 NOTE — Telephone Encounter (Signed)
*  STAT* If patient is at the pharmacy, call can be transferred to refill team.   1. Which medications need to be refilled? (please list name of each medication and dose if known) metoprolol tartrate (LOPRESSOR) 25 MG tablet hydrochlorothiazide (HYDRODIURIL) 25 MG tablet  Semaglutide-Weight Management 2.4 MG/0.75ML SOAJ   2. Which pharmacy/location (including street and city if local pharmacy) is medication to be sent to?  [facility]  [phone]   3. Do they need a 30 day or 90 day supply? 59   Pt requesting a pharmacy switch to [facility]

## 2021-09-10 NOTE — Telephone Encounter (Signed)
PA approved through 03/17/22

## 2021-10-15 ENCOUNTER — Other Ambulatory Visit: Payer: Self-pay

## 2021-10-15 ENCOUNTER — Emergency Department (HOSPITAL_COMMUNITY)
Admission: EM | Admit: 2021-10-15 | Discharge: 2021-10-15 | Disposition: A | Discharge status: Home / Self Care | Payer: BC Managed Care – PPO | Attending: Emergency Medicine | Admitting: Emergency Medicine

## 2021-10-15 ENCOUNTER — Emergency Department (HOSPITAL_COMMUNITY): Payer: BC Managed Care – PPO

## 2021-10-15 DIAGNOSIS — K5792 Diverticulitis of intestine, part unspecified, without perforation or abscess without bleeding: Secondary | ICD-10-CM | POA: Insufficient documentation

## 2021-10-15 DIAGNOSIS — R1032 Left lower quadrant pain: Secondary | ICD-10-CM | POA: Diagnosis not present

## 2021-10-15 DIAGNOSIS — Z79899 Other long term (current) drug therapy: Secondary | ICD-10-CM | POA: Insufficient documentation

## 2021-10-15 DIAGNOSIS — R109 Unspecified abdominal pain: Secondary | ICD-10-CM | POA: Diagnosis not present

## 2021-10-15 DIAGNOSIS — E876 Hypokalemia: Secondary | ICD-10-CM | POA: Diagnosis not present

## 2021-10-15 DIAGNOSIS — D72829 Elevated white blood cell count, unspecified: Secondary | ICD-10-CM | POA: Insufficient documentation

## 2021-10-15 DIAGNOSIS — K5900 Constipation, unspecified: Secondary | ICD-10-CM | POA: Insufficient documentation

## 2021-10-15 DIAGNOSIS — I10 Essential (primary) hypertension: Secondary | ICD-10-CM | POA: Diagnosis not present

## 2021-10-15 LAB — CBC
HCT: 42.5 % (ref 36.0–46.0)
Hemoglobin: 14.2 g/dL (ref 12.0–15.0)
MCH: 31.5 pg (ref 26.0–34.0)
MCHC: 33.4 g/dL (ref 30.0–36.0)
MCV: 94.2 fL (ref 80.0–100.0)
Platelets: 340 10*3/uL (ref 150–400)
RBC: 4.51 MIL/uL (ref 3.87–5.11)
RDW: 14.4 % (ref 11.5–15.5)
WBC: 14.5 10*3/uL — ABNORMAL HIGH (ref 4.0–10.5)
nRBC: 0 % (ref 0.0–0.2)

## 2021-10-15 LAB — COMPREHENSIVE METABOLIC PANEL WITH GFR
ALT: 21 U/L (ref 0–44)
AST: 25 U/L (ref 15–41)
Albumin: 3.9 g/dL (ref 3.5–5.0)
Alkaline Phosphatase: 83 U/L (ref 38–126)
Anion gap: 12 (ref 5–15)
BUN: 15 mg/dL (ref 6–20)
CO2: 29 mmol/L (ref 22–32)
Calcium: 10.1 mg/dL (ref 8.9–10.3)
Chloride: 96 mmol/L — ABNORMAL LOW (ref 98–111)
Creatinine, Ser: 0.85 mg/dL (ref 0.44–1.00)
GFR, Estimated: >60 mL/min
Glucose, Bld: 90 mg/dL (ref 70–99)
Potassium: 3.4 mmol/L — ABNORMAL LOW (ref 3.5–5.1)
Sodium: 137 mmol/L (ref 135–145)
Total Bilirubin: 1 mg/dL (ref 0.3–1.2)
Total Protein: 8.2 g/dL — ABNORMAL HIGH (ref 6.5–8.1)

## 2021-10-15 LAB — URINALYSIS, ROUTINE W REFLEX MICROSCOPIC
Bilirubin Urine: NEGATIVE
Glucose, UA: NEGATIVE mg/dL
Hgb urine dipstick: NEGATIVE
Ketones, ur: NEGATIVE mg/dL
Leukocytes,Ua: NEGATIVE
Nitrite: NEGATIVE
Protein, ur: NEGATIVE mg/dL
Specific Gravity, Urine: 1.027 (ref 1.005–1.030)
pH: 5 (ref 5.0–8.0)

## 2021-10-15 LAB — LIPASE, BLOOD: Lipase: 30 U/L (ref 11–51)

## 2021-10-15 LAB — I-STAT BETA HCG BLOOD, ED (MC, WL, AP ONLY): I-stat hCG, quantitative: <5 m[IU]/mL

## 2021-10-15 MED ORDER — OXYCODONE-ACETAMINOPHEN 5-325 MG PO TABS
1.0000 | ORAL_TABLET | Freq: Once | ORAL | Status: AC
  Administered 2021-10-15: 1 via ORAL
  Filled 2021-10-15: qty 1

## 2021-10-15 MED ORDER — IOHEXOL 350 MG/ML SOLN
70.0000 mL | Freq: Once | INTRAVENOUS | Status: AC | PRN
  Administered 2021-10-15: 70 mL via INTRAVENOUS

## 2021-10-15 MED ORDER — ONDANSETRON HCL 4 MG/2ML IJ SOLN
4.0000 mg | Freq: Once | INTRAMUSCULAR | Status: AC
  Administered 2021-10-15: 4 mg via INTRAVENOUS
  Filled 2021-10-15: qty 2

## 2021-10-15 MED ORDER — AMOXICILLIN-POT CLAVULANATE 875-125 MG PO TABS
1.0000 | ORAL_TABLET | Freq: Once | ORAL | Status: AC
  Administered 2021-10-15: 1 via ORAL
  Filled 2021-10-15: qty 1

## 2021-10-15 MED ORDER — MORPHINE SULFATE (PF) 4 MG/ML IV SOLN
4.0000 mg | Freq: Once | INTRAVENOUS | Status: AC
  Administered 2021-10-15: 4 mg via INTRAVENOUS
  Filled 2021-10-15: qty 1

## 2021-10-15 MED ORDER — ONDANSETRON 4 MG PO TBDP: 4.0000 mg | ORAL_TABLET | Freq: Three times a day (TID) | ORAL | 0 refills | Status: DC | PRN

## 2021-10-15 MED ORDER — AMOXICILLIN-POT CLAVULANATE 875-125 MG PO TABS: 1.0000 | ORAL_TABLET | Freq: Three times a day (TID) | ORAL | 0 refills | Status: AC

## 2021-10-15 MED ORDER — OXYCODONE-ACETAMINOPHEN 5-325 MG PO TABS: 1.0000 | ORAL_TABLET | Freq: Four times a day (QID) | ORAL | 0 refills | Status: DC | PRN

## 2021-10-15 NOTE — ED Provider Notes (Signed)
[facility] EMERGENCY DEPARTMENT Provider Note   CSN: [id number] Arrival date & time: 10/15/21  1027     History  Chief Complaint  Patient presents with   Abdominal Pain    Judy [name] is a 56 y.o. female.  With PMH of HTN, obesity, HOCM, who presents with left lower quadrant abdominal pain.  She said pain started happening about yesterday and has worsened today.  It is worse with movements.  It started more generalized lower abdominal pain but has localized to the left lower quadrant today.  Is been associated with constipation and change in her bowel movements.  She has had no fevers, no nausea, no vomiting, no urinary symptoms, no chest pain or shortness of breath.  She been taking Tylenol without relief.  She does have history of diverticulitis and this feels somewhat similar.  She had a colonoscopy done last year and was told she had diverticula but otherwise no concerning findings.   Abdominal Pain      Home Medications Prior to Admission medications   Medication Sig Start Date End Date Taking? Authorizing Provider  acetaminophen (TYLENOL) 650 MG CR tablet Take 650 mg by mouth 2 (two) times daily as needed for pain.   Yes [provider]  amoxicillin-clavulanate (AUGMENTIN) 875-125 MG tablet Take 1 tablet by mouth 3 (three) times daily for 5 days. 10/15/21 10/20/21 Yes Elgie Congo, MD  Cholecalciferol (PA VITAMIN D-3 GUMMY PO) Take 2 each by mouth daily.   Yes [provider]  hydrochlorothiazide (HYDRODIURIL) 25 MG tablet Take 1 tablet (25 mg total) by mouth daily. 09/07/21  Yes Skeet Latch, MD  metoprolol tartrate (LOPRESSOR) 25 MG tablet Take 0.5 tablets (12.5 mg total) by mouth 2 (two) times daily. 09/08/21  Yes Skeet Latch, MD  Multiple Vitamin (MULTIVITAMIN) tablet Take 1 tablet by mouth daily.   Yes [provider]  ondansetron (ZOFRAN-ODT) 4 MG disintegrating tablet Take 1 tablet (4 mg total) by mouth every  8 (eight) hours as needed for nausea or vomiting. 10/15/21  Yes Elgie Congo, MD  oxyCODONE-acetaminophen (PERCOCET/ROXICET) 5-325 MG tablet Take 1 tablet by mouth every 6 (six) hours as needed for severe pain. 10/15/21  Yes Elgie Congo, MD  Semaglutide-Weight Management 2.4 MG/0.75ML SOAJ Inject 2.4 mg into the skin once a week. 09/08/21  Yes Skeet Latch, MD      Allergies    Naproxen sodium    Review of Systems   Review of Systems  Gastrointestinal:  Positive for abdominal pain.    Physical Exam Updated Vital Signs BP (!) 127/55   Pulse 73   Temp 98.4 F (36.9 C) (Oral)   Resp 18   Ht '5\' 7"'$  (1.702 m)   Wt 106.6 kg   SpO2 99%   BMI 36.81 kg/m  Physical Exam Constitutional: Alert and oriented.  Slightly uncomfortable but nontoxic in no acute distress Eyes: Conjunctivae are normal. ENT      Head: Normocephalic and atraumatic.      Nose: No congestion.      Mouth/Throat: Mucous membranes are moist.      Neck: No stridor. Cardiovascular: S1, S2,  Normal and symmetric distal pulses are present in all extremities.Warm and well perfused. Respiratory: Normal respiratory effort. Breath sounds are normal. Gastrointestinal: Soft and nondistended with left mid abdomen and left lower quadrant tenderness, no rebound, no guarding, no CVA tenderness Musculoskeletal: Normal range of motion in all extremities.      Right lower leg:  No tenderness or edema.      Left lower leg: No tenderness or edema. Neurologic: Normal speech and language. No gross focal neurologic deficits are appreciated. Skin: Skin is warm, dry and intact. No rash noted. Psychiatric: Mood and affect are normal. Speech and behavior are normal.  ED Results / Procedures / Treatments   Labs (all labs ordered are listed, but only abnormal results are displayed) Labs Reviewed  URINALYSIS, ROUTINE W REFLEX MICROSCOPIC - Abnormal; Notable for the following components:      Result Value   Color, Urine AMBER  (*)    All other components within normal limits  COMPREHENSIVE METABOLIC PANEL - Abnormal; Notable for the following components:   Potassium 3.4 (*)    Chloride 96 (*)    Total Protein 8.2 (*)    All other components within normal limits  CBC - Abnormal; Notable for the following components:   WBC 14.5 (*)    All other components within normal limits  LIPASE, BLOOD  I-STAT BETA HCG BLOOD, ED ([facility], [facility], [facility] ONLY)    EKG None  Radiology CT ABDOMEN PELVIS W CONTRAST  Result Date: 10/15/2021 CLINICAL DATA:  Left lower quadrant pain. EXAM: CT ABDOMEN AND PELVIS WITH CONTRAST TECHNIQUE: Multidetector CT imaging of the abdomen and pelvis was performed using the standard protocol following bolus administration of intravenous contrast. RADIATION DOSE REDUCTION: This exam was performed according to the departmental dose-optimization program which includes automated exposure control, adjustment of the mA and/or kV according to patient size and/or use of iterative reconstruction technique. CONTRAST:  76m OMNIPAQUE IOHEXOL 350 MG/ML SOLN COMPARISON:  CT abdomen and pelvis 12/12/2018 FINDINGS: Lower chest: No acute abnormality. Hepatobiliary: No focal liver abnormality is seen. No gallstones, gallbladder wall thickening, or biliary dilatation. Pancreas: Unremarkable. No pancreatic ductal dilatation or surrounding inflammatory changes. Spleen: Normal in size without focal abnormality. Adrenals/Urinary Tract: Adrenal glands are unremarkable. Kidneys are normal, without renal calculi, focal lesion, or hydronephrosis. Bladder is unremarkable. Stomach/Bowel: There is diffuse colonic diverticulosis. There is wall thickening and inflammation involving diverticula in the distal descending colon compatible with acute diverticulitis. There is no evidence for abscess or perforation. No bowel obstruction. No dilated bowel loops. The appendix, small bowel and stomach are within normal limits. Vascular/Lymphatic: No  significant vascular findings are present. No enlarged abdominal or pelvic lymph nodes. Reproductive: Uterus and bilateral adnexa are unremarkable. Other: There is a small fat containing umbilical hernia. Musculoskeletal: No acute or significant osseous findings. IMPRESSION: Acute uncomplicated diverticulitis of the distal descending colon. Electronically Signed   By: ARonney AstersM.D.   On: 10/15/2021 18:43    Procedures Procedures  Remain on constant cardiac monitoring normal sinus rhythm.  Medications Ordered in ED Medications  morphine (PF) 4 MG/ML injection 4 mg (4 mg Intravenous Given 10/15/21 1735)  ondansetron (ZOFRAN) injection 4 mg (4 mg Intravenous Given 10/15/21 1735)  iohexol (OMNIPAQUE) 350 MG/ML injection 70 mL (70 mLs Intravenous Contrast Given 10/15/21 1835)  amoxicillin-clavulanate (AUGMENTIN) 875-125 MG per tablet 1 tablet (1 tablet Oral Given 10/15/21 1922)  oxyCODONE-acetaminophen (PERCOCET/ROXICET) 5-325 MG per tablet 1 tablet (1 tablet Oral Given 10/15/21 1922)    ED Course/ Medical Decision Making/ A&P                           Medical Decision Making Judy PALMATIERis a 56y.o. female.  With PMH of HTN, obesity, HOCM, who presents with left lower quadrant abdominal pain.  Patient presented with left lower quadrant atraumatic pain that was likely related to either diverticulitis versus UTI or possible uncomplicated colitis or enteritis.  Labs remarkable for leukocytosis 14.5 with no anemia hemoglobin 14.2 and mild hypokalemia 3.4.  UA negative nitrites negative leukocyte esterase, no evidence of UTI.  CTAP obtained with evidence of acute uncomplicated diverticulitis.  This is consistent with her symptoms.  Her pain is well controlled and she is able to tolerate p.o.  I discharge patient with Augmentin prescription as well as as needed Zofran and Percocet for pain control.  Advise close follow-up with PCP and strict return precautions.  She is in agreement with this plan  and safe for discharge home.  Amount and/or Complexity of Data Reviewed Labs: ordered. Radiology: ordered.  Risk Prescription drug management.    Final Clinical Impression(s) / ED Diagnoses Final diagnoses:  Left lower quadrant abdominal pain  Diverticulitis    Rx / DC Orders ED Discharge Orders          Ordered    amoxicillin-clavulanate (AUGMENTIN) 875-125 MG tablet  3 times daily        10/15/21 1928    ondansetron (ZOFRAN-ODT) 4 MG disintegrating tablet  Every 8 hours PRN        10/15/21 1928    oxyCODONE-acetaminophen (PERCOCET/ROXICET) 5-325 MG tablet  Every 6 hours PRN        10/15/21 1928              Elgie Congo, MD 10/15/21 2040

## 2021-10-15 NOTE — ED Notes (Signed)
Pt asking for pain medication. Offered pt tylenol or IBU. Pt reports she just took tylenol arthritis strength.

## 2021-10-15 NOTE — ED Notes (Signed)
Pt unable to urinate in triage  

## 2021-10-15 NOTE — ED Triage Notes (Signed)
Pt. Stated, Im having left lower stomach pain for 2 days. When I walk its worse.

## 2021-10-15 NOTE — Discharge Instructions (Addendum)
Your CT scan showed evidence of diverticulitis causing your pain.  Take the antibiotics as prescribed.  You can take Tylenol and ibuprofen for pain control but if pain still severe take the Percocet as needed.  Do not take while drinking alcohol or driving.  Take the Zofran as needed for nausea or vomiting.  Follow-up with your primary care doctor regarding your visit to the ER today.  Come back for any severe worsening pain, uncontrolled nausea and vomiting, or any other symptoms concerning to you.

## 2021-10-23 DIAGNOSIS — R1013 Epigastric pain: Secondary | ICD-10-CM | POA: Diagnosis not present

## 2021-10-23 DIAGNOSIS — E669 Obesity, unspecified: Secondary | ICD-10-CM | POA: Diagnosis not present

## 2021-10-23 DIAGNOSIS — I10 Essential (primary) hypertension: Secondary | ICD-10-CM | POA: Diagnosis not present

## 2021-10-23 DIAGNOSIS — Z79899 Other long term (current) drug therapy: Secondary | ICD-10-CM | POA: Diagnosis not present

## 2021-10-26 DIAGNOSIS — E669 Obesity, unspecified: Secondary | ICD-10-CM | POA: Diagnosis not present

## 2021-10-26 DIAGNOSIS — I10 Essential (primary) hypertension: Secondary | ICD-10-CM | POA: Diagnosis not present

## 2021-10-26 DIAGNOSIS — K5792 Diverticulitis of intestine, part unspecified, without perforation or abscess without bleeding: Secondary | ICD-10-CM | POA: Diagnosis not present

## 2021-10-26 DIAGNOSIS — R109 Unspecified abdominal pain: Secondary | ICD-10-CM | POA: Diagnosis not present

## 2021-10-30 DIAGNOSIS — E785 Hyperlipidemia, unspecified: Secondary | ICD-10-CM | POA: Diagnosis not present

## 2021-10-30 DIAGNOSIS — Z79899 Other long term (current) drug therapy: Secondary | ICD-10-CM | POA: Diagnosis not present

## 2021-10-30 DIAGNOSIS — I10 Essential (primary) hypertension: Secondary | ICD-10-CM | POA: Diagnosis not present

## 2021-10-30 DIAGNOSIS — G609 Hereditary and idiopathic neuropathy, unspecified: Secondary | ICD-10-CM | POA: Diagnosis not present

## 2021-12-01 ENCOUNTER — Other Ambulatory Visit: Payer: Self-pay | Admitting: Internal Medicine

## 2021-12-01 DIAGNOSIS — Z1231 Encounter for screening mammogram for malignant neoplasm of breast: Secondary | ICD-10-CM

## 2021-12-28 ENCOUNTER — Encounter: Payer: Self-pay | Admitting: Nurse Practitioner

## 2021-12-28 ENCOUNTER — Ambulatory Visit (INDEPENDENT_AMBULATORY_CARE_PROVIDER_SITE_OTHER): Payer: BC Managed Care – PPO | Admitting: Nurse Practitioner

## 2021-12-28 VITALS — BP 126/82 | HR 115 | Ht 67.0 in | Wt 244.0 lb

## 2021-12-28 DIAGNOSIS — R112 Nausea with vomiting, unspecified: Secondary | ICD-10-CM

## 2021-12-28 DIAGNOSIS — R1013 Epigastric pain: Secondary | ICD-10-CM

## 2021-12-28 MED ORDER — FAMOTIDINE 20 MG PO TABS: 20.0000 mg | ORAL_TABLET | Freq: Every day | ORAL | 1 refills | Status: DC

## 2021-12-28 MED ORDER — DICYCLOMINE HCL 10 MG PO CAPS: 10.0000 mg | ORAL_CAPSULE | Freq: Three times a day (TID) | ORAL | 0 refills | Status: DC | PRN

## 2021-12-28 NOTE — Progress Notes (Unsigned)
12/28/2021 Judy [name] [id number] 1965/10/10   Chief Complaint: Abdominal pain   History of Present Illness: Judy [name]. Judy [name] is a 56 year old female with a past medical history of obesity, hypertension, hypertrophic obstructive cardiomyopathy, prediabetes and diverticulitis 2021 and 10/2021. She is known by Dr. Hilarie Fredrickson. She presents today for further evaluation regarding diverticulitis and upper abdominal pain.   She has sharp pains in stomach. She had N/V and abdomina pain. She went to the ED. Middle low   Upper abd pain  Divert around and below belly button   She typically passes two stools twice daily.  Constipation triggered diverticulitis. Nausea. Diarrhea.   Nose bleed 2021, nausea  She has heartburn once or twice weekly or less. No dysphagia. No hematemesis.   Diverticulitis pain another episode before thanksgiving for steroids  Antibiotic and Prednsione.   Stomach burning started  off and on month   Last BM last night 2, first solid, 2nd soft and loose.  No Bm yet today.      Latest Ref Rng & Units 10/15/2021    3:42 PM 04/15/2021   10:19 AM 02/25/2021   10:09 AM  CBC  WBC 4.0 - 10.5 K/uL 14.5  7.6  9.4   Hemoglobin 12.0 - 15.0 g/dL 14.2  13.3  13.1   Hematocrit 36.0 - 46.0 % 42.5  40.1  39.0   Platelets 150 - 400 K/uL 340  320  315        Latest Ref Rng & Units 10/15/2021    3:42 PM 04/15/2021   10:19 AM 02/25/2021   10:09 AM  CMP  Glucose 70 - 99 mg/dL 90  86  80   BUN 6 - 20 mg/dL '15  9  13   '$ Creatinine 0.44 - 1.00 mg/dL 0.85  0.95  0.89   Sodium 135 - 145 mmol/L 137  141  138   Potassium 3.5 - 5.1 mmol/L 3.4  3.8  3.5   Chloride 98 - 111 mmol/L 96  100  97   CO2 22 - 32 mmol/L '29  27  29   '$ Calcium 8.9 - 10.3 mg/dL 10.1  9.8  9.8   Total Protein 6.5 - 8.1 g/dL 8.2  7.3  7.7   Total Bilirubin 0.3 - 1.2 mg/dL 1.0  0.4  0.4   Alkaline Phos 38 - 126 U/L 83  81  93   AST 15 - 41 U/L '25  19  23   '$ ALT 0 - 44 U/L '21  14  15     '$ CTAP with contrast  10/15/2021: FINDINGS: Lower chest: No acute abnormality.   Hepatobiliary: No focal liver abnormality is seen. No gallstones, gallbladder wall thickening, or biliary dilatation.   Pancreas: Unremarkable. No pancreatic ductal dilatation or surrounding inflammatory changes.   Spleen: Normal in size without focal abnormality.   Adrenals/Urinary Tract: Adrenal glands are unremarkable. Kidneys are normal, without renal calculi, focal lesion, or hydronephrosis. Bladder is unremarkable.   Stomach/Bowel: There is diffuse colonic diverticulosis. There is wall thickening and inflammation involving diverticula in the distal descending colon compatible with acute diverticulitis. There is no evidence for abscess or perforation. No bowel obstruction. No dilated bowel loops. The appendix, small bowel and stomach are within normal limits.   Vascular/Lymphatic: No significant vascular findings are present. No enlarged abdominal or pelvic lymph nodes.   Reproductive: Uterus and bilateral adnexa are unremarkable.   Other: There is a small fat containing umbilical hernia.  Musculoskeletal: No acute or significant osseous findings.   IMPRESSION: Acute uncomplicated diverticulitis of the distal descending colon.  ECHO 07/03/2019: 1. Normal LV function; grade 1 diastolic dysfunction; mild asymmetric septal hypertrophy; no LVOT gradient at rest or with valsalva; mild to moderate MR; mild LAE. Findings possibly consistent with HCM; suggest cardiac MRI to further assess. 2. Left ventricular ejection fraction, by estimation, is 60 to 65%. The left ventricle has normal function. The left ventricle has no regional wall motion abnormalities. Left ventricular diastolic parameters are consistent with Grade I diastolic dysfunction (impaired relaxation). 3. Right ventricular systolic function is normal. The right ventricular size is normal. There is normal pulmonary artery systolic pressure. 4. Left atrial  size was mildly dilated. 5. The mitral valve is normal in structure. Mild to moderate mitral valve regurgitation. No evidence of mitral stenosis. 6. The aortic valve is tricuspid. Aortic valve regurgitation is not visualized. Mild aortic valve sclerosis is present, with no evidence of aortic valve stenosis. 7. The inferior vena cava is normal in size with greater than 50% respiratory variability, suggesting right atrial pressure of 3 mmHg.  GI PROCEDURES:   Colonoscopy 05/16/2020 by Dr. Hilarie Fredrickson: - Moderate diverticulosis from ascending colon to sigmoid colon. - The examination was otherwise normal on direct and retroflexion views. - No specimens collected. - 10 year colonoscopy recall   Past Medical History:  Diagnosis Date   Essential hypertension 06/14/2019   HOCM (hypertrophic obstructive cardiomyopathy) ([place]) 02/04/2017   Hypertension    Obesity (BMI 30-39.9) 08/21/2019   Pre-diabetes 01/23/2021   Past Surgical History:  Procedure Laterality Date   BARTHOLIN CYST MARSUPIALIZATION     CESAREAN SECTION     X3 with BTL   DILATATION & CURETTAGE/HYSTEROSCOPY WITH TRUECLEAR N/A 04/12/2013   Procedure: DILATATION & CURETTAGE/HYSTEROSCOPY ;  Surgeon: Anastasio Auerbach, MD;  Location: [facility];  Service: Gynecology;  Laterality: N/A;   TUBAL LIGATION      Current Medications, Allergies, Past Medical History, Past Surgical History, Family History and Social History were reviewed in [employer] record.   Review of Systems:   Constitutional: Negative for fever, sweats, chills or weight loss.  Respiratory: Negative for shortness of breath.   Cardiovascular: Negative for chest pain, palpitations and leg swelling.  Gastrointestinal: See HPI.  Musculoskeletal: Negative for back pain or muscle aches.  Neurological: Negative for dizziness, headaches or paresthesias.    Physical Exam: There were no vitals taken for this visit. General: Well developed, w   ***female in no  acute distress. Head: Normocephalic and atraumatic. Eyes: No scleral icterus. Conjunctiva pink . Ears: Normal auditory acuity. Mouth: Dentition intact. No ulcers or lesions.  Lungs: Clear throughout to auscultation. Heart: Regular rate and rhythm, no murmur. Abdomen: Soft, nontender and nondistended. No masses or hepatomegaly. Normal bowel sounds x 4 quadrants.  Rectal: Deferred.  Musculoskeletal: Symmetrical with no gross deformities. Extremities: No edema. Neurological: Alert oriented x 4. No focal deficits.  Psychological: Alert and cooperative. Normal mood and affect  Assessment and Recommendations:  66) 56 year old female with diverticulitis per CT    2) Hypertrophic obstructive cardiomyopathy

## 2021-12-28 NOTE — Patient Instructions (Addendum)
We have sent the following medications to your pharmacy for you to pick up at your convenience: Dicyclomine 10 mg & Famotidine 20 mg  We have scheduled your for a follow up with Doctor Pyrtle on March 09, 2022 at 9:30 am  Thank you for trusting me with your gastrointestinal care!   Carl Best, CRNP

## 2021-12-29 ENCOUNTER — Encounter: Payer: Self-pay | Admitting: Nurse Practitioner

## 2021-12-29 NOTE — Progress Notes (Signed)
Addendum: Reviewed and agree with assessment and management plan. Demichael Traum M, MD  

## 2022-01-26 ENCOUNTER — Other Ambulatory Visit: Payer: Self-pay | Admitting: Nurse Practitioner

## 2022-01-26 NOTE — Telephone Encounter (Signed)
Please advise 

## 2022-02-08 ENCOUNTER — Encounter: Payer: Self-pay | Admitting: *Deleted

## 2022-02-22 ENCOUNTER — Other Ambulatory Visit: Payer: Self-pay | Admitting: Cardiovascular Disease

## 2022-02-22 NOTE — Telephone Encounter (Signed)
Rx(s) sent to pharmacy electronically.  

## 2022-03-09 ENCOUNTER — Ambulatory Visit: Payer: Self-pay | Admitting: Internal Medicine

## 2022-05-24 ENCOUNTER — Other Ambulatory Visit: Payer: Self-pay | Admitting: Cardiovascular Disease

## 2022-05-24 NOTE — Telephone Encounter (Signed)
Please call pt to schedule overdue follow-up appointment with Dr. Duke Salvia or APP for refills. Last seen by APP 04/2021, was supposed to follow-up 3 months later. Thank you!

## 2022-05-25 NOTE — Telephone Encounter (Signed)
Left message for patient to call and schedule her overdue follow up with Dr. Poulan / APP for medication refills 

## 2022-05-28 NOTE — Telephone Encounter (Signed)
Left message for patient to call and schedule overdue follow up  with Dr. Mono / APP for medication refills 

## 2022-06-03 NOTE — Telephone Encounter (Signed)
Left message for patient to call and schedule the over due follow up with Dr. Duke Salvia / APP for medication refills

## 2022-06-09 NOTE — Telephone Encounter (Signed)
Patient is scheduled 06/14/22 with Gillian Shields, NP

## 2022-06-09 NOTE — Telephone Encounter (Signed)
Rx request sent to pharmacy.  

## 2022-06-14 ENCOUNTER — Ambulatory Visit (HOSPITAL_BASED_OUTPATIENT_CLINIC_OR_DEPARTMENT_OTHER): Payer: Self-pay | Admitting: Family

## 2022-08-23 ENCOUNTER — Other Ambulatory Visit: Payer: Self-pay | Admitting: Cardiovascular Disease

## 2022-10-06 IMAGING — CT CT HEART MORP W/ CTA COR W/ SCORE W/ CA W/CM &/OR W/O CM
1 series · 13 of 20 positions shown, 17 images · non-contrast
Comparison: None Available.
COMPARISON: None Available.

Addendum:
EXAM:
OVER-READ INTERPRETATION  CT CHEST

The following report is a limited chest CT over-read performed by
06/01/2021. This over-read does not include interpretation of cardiac
or coronary anatomy or pathology. The coronary calcium
score/coronary CTA interpretation by the cardiologist is attached.
CLINICAL DATA: Chest pain
Cardiac/Coronary CTA
TECHNIQUE: A non-contrast, gated CT scan was obtained with axial slices of 3 mm
through the heart for calcium scoring. Calcium scoring was performed
using the Agatston method. A 110 kV prospective, gated, contrast
cardiac scan was obtained. Gantry rotation speed was 250 msecs and
collimation was 0.6 mm. Two sublingual nitroglycerin tablets (0.8
mg) were given. The 3D data set was reconstructed in 5% intervals of
the 35-75% of the R-R cycle. Diastolic phases were analyzed on a
dedicated workstation using MPR, MIP, and VRT modes. The patient
received 95 cc of contrast.

[Series 489: findings · 0.68mm/px · 13 of 22 slices shown, 17 images]
[im 2/22  vessel]
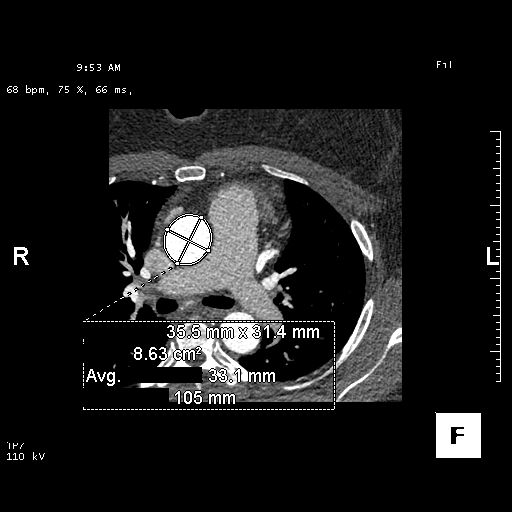
[im 2/22  lung]
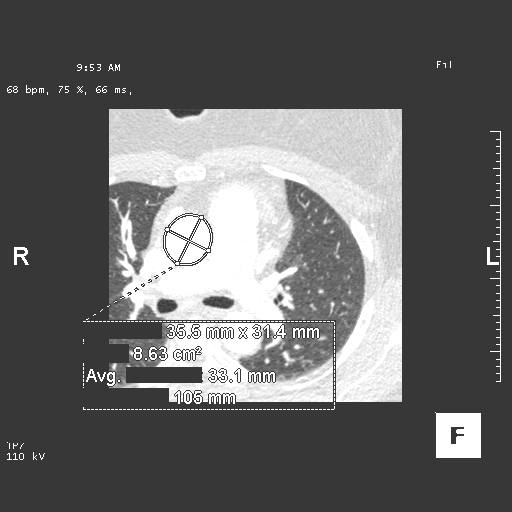
[im 4/22  vessel]
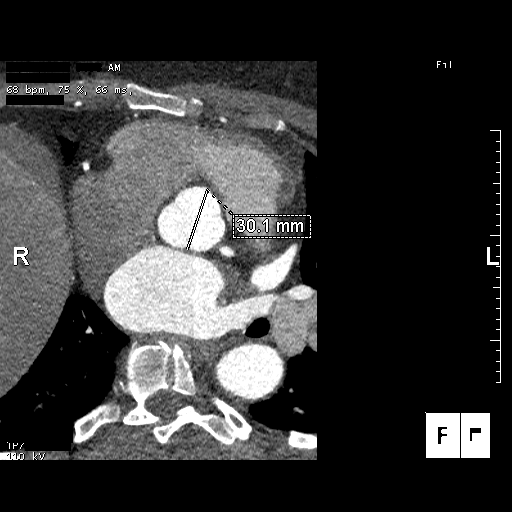
[im 5/22  vessel]
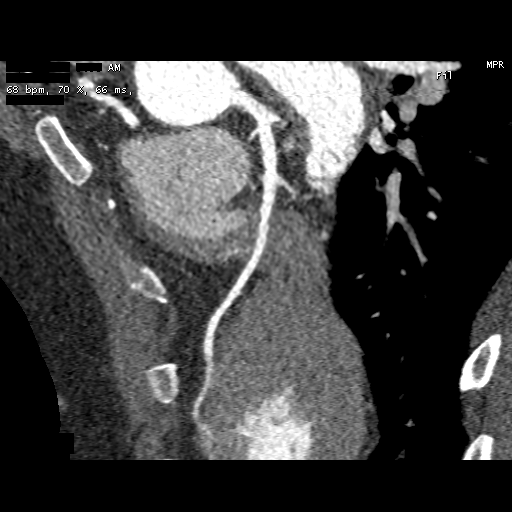
[im 6/22  vessel]
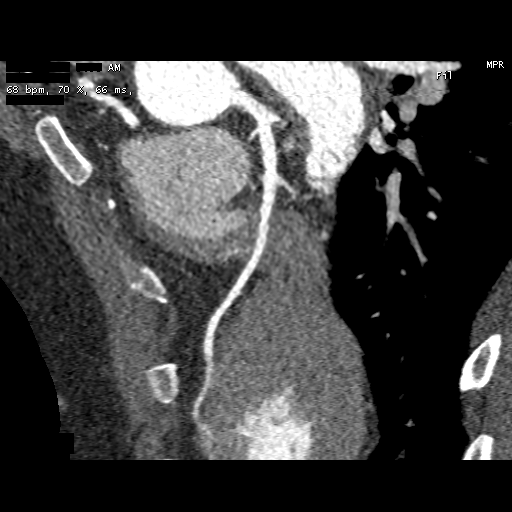
[im 8/22  vessel]
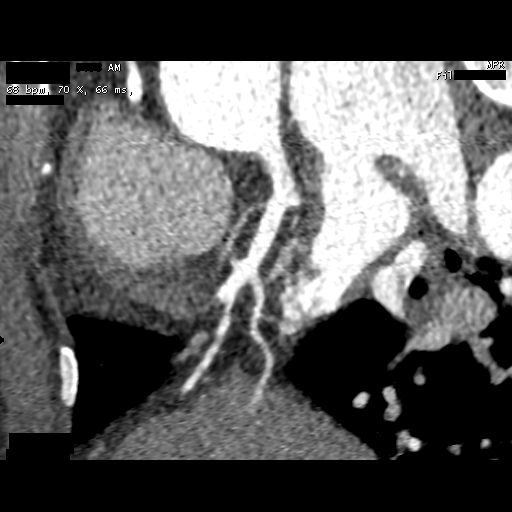
[im 8/22  lung]
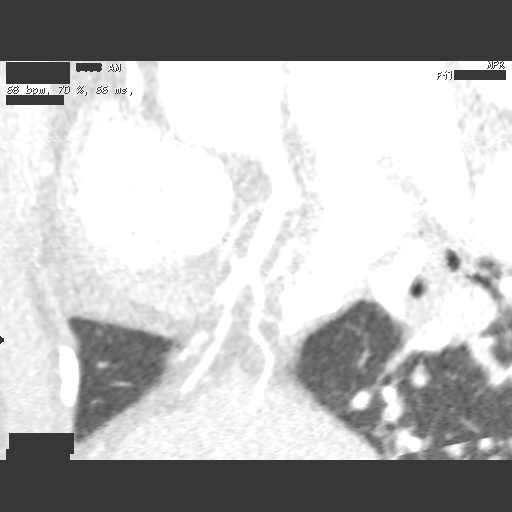
[im 9/22  vessel]
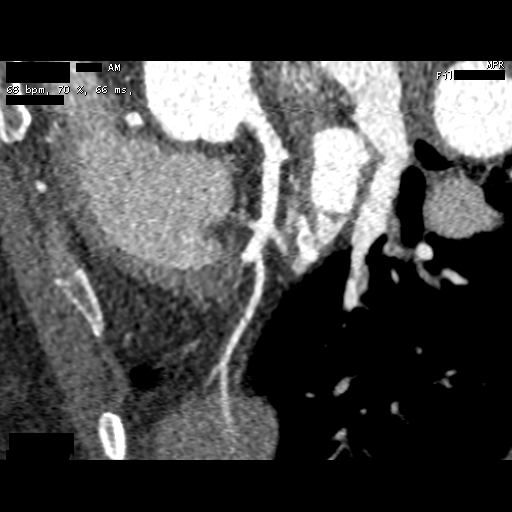
[im 12/22  vessel]
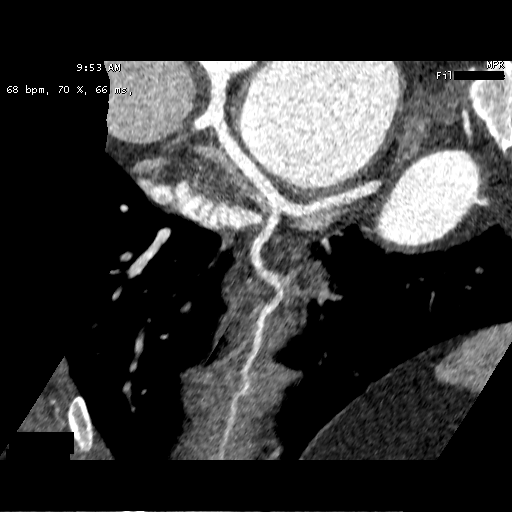
[im 13/22  vessel]
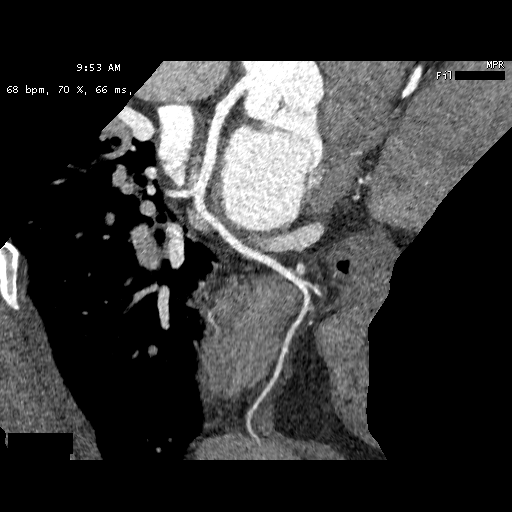
[im 14/22  vessel]
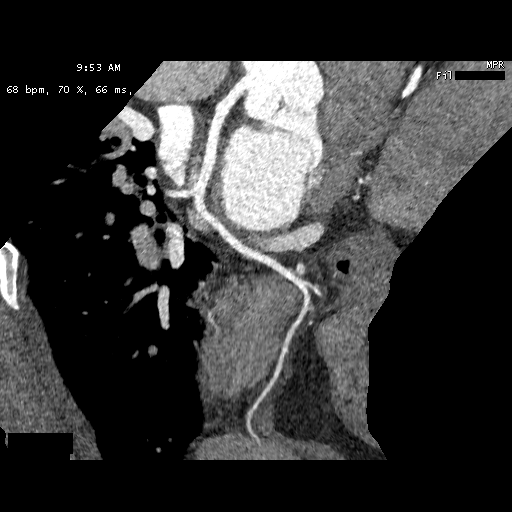
[im 14/22  lung]
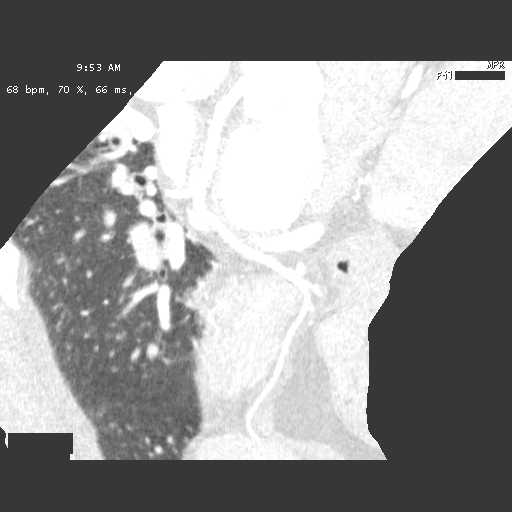
[im 16/22  vessel]
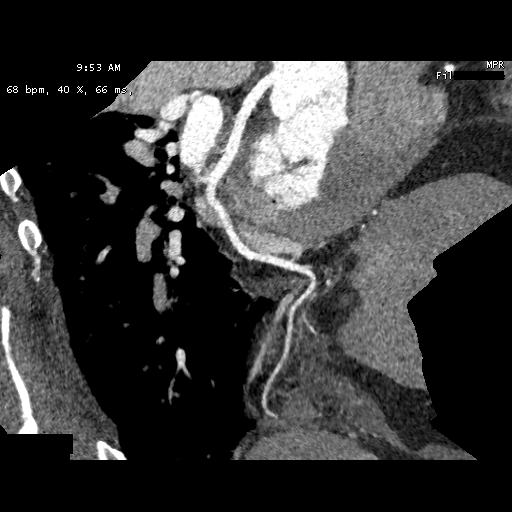
[im 17/22  vessel]
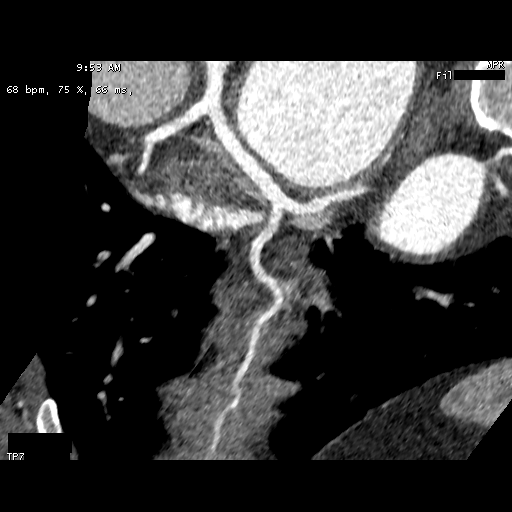
[im 18/22  vessel]
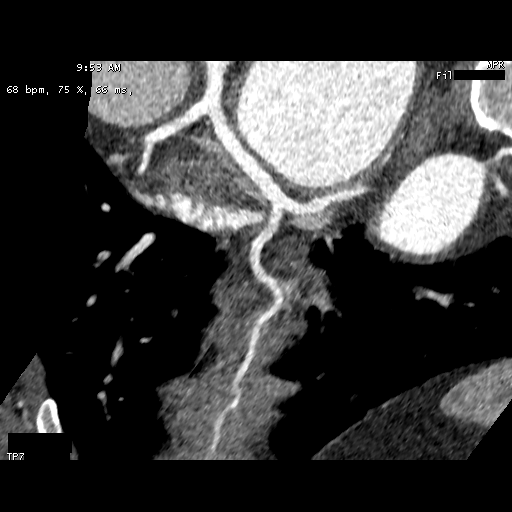
[im 20/22  vessel]
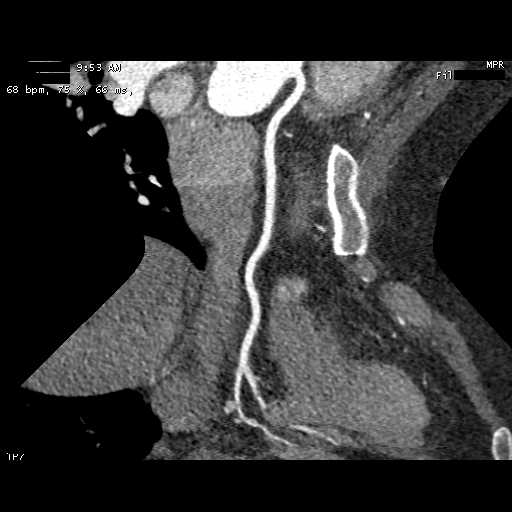
[im 20/22  lung]
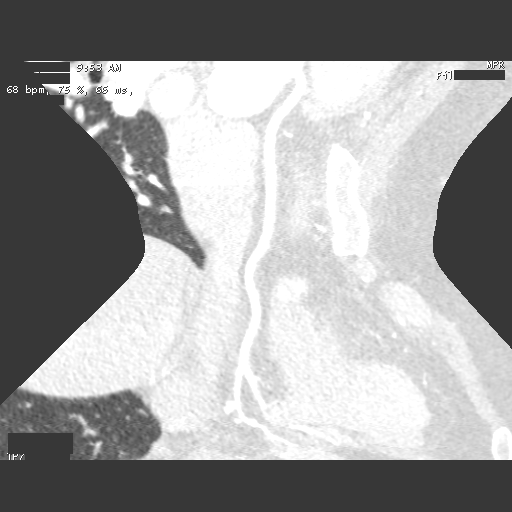

[13 of 20 positions shown; findings below may reference images not displayed]

FINDINGS: Vascular: There are no significant vascular findings.

Mediastinum/Nodes: There are no enlarged lymph nodes.The visualized
esophagus demonstrates no significant findings.

Lungs/Pleura: Clear lungs. No pneumothorax or pleural effusion.

Upper abdomen: No acute abnormality.

Musculoskeletal/Chest wall: No chest wall abnormality. No acute or
significant osseous findings.
IMPRESSION: 1. No significant extracardiac findings.
FINDINGS: Image quality: Excellent.

Noise artifact is: Limited.

Coronary Arteries:  Normal coronary origin.  Right dominance.

Left main: The left main is a large caliber vessel with a normal
take off from the left coronary cusp that bifurcates to form a left
anterior descending artery and a left circumflex artery. There is no
plaque or stenosis.

Left anterior descending artery: The LAD is patent without evidence
of plaque or stenosis. The LAD gives off 2 patent diagonal branches.

Left circumflex artery: The LCX is non-dominant. There is minimal
non-calcified plaque (<25%). The LCX gives off 2 patent obtuse
marginal branches.

Right coronary artery: The RCA is dominant with normal take off from
the right coronary cusp. There is minimal non-calcified plaque
(<25%). The RCA terminates as a PDA and right posterolateral branch
without evidence of plaque or stenosis.

Right Atrium: Right atrial size is within normal limits.

Right Ventricle: The right ventricular cavity is within normal
limits.

Left Atrium: Left atrial size is normal in size with no left atrial
appendage filling defect. Small PFO.

Left Ventricle: The ventricular cavity size is within normal limits.

Pulmonary arteries: Normal in size without proximal filling defect.

Pulmonary veins: Normal pulmonary venous drainage.

Pericardium: Normal thickness without significant effusion or
calcium present.

Cardiac valves: The aortic valve is trileaflet without significant
calcification. The mitral valve is normal without significant
calcification.

Aorta: Normal caliber without significant disease.

Extra-cardiac findings: See attached radiology report for
non-cardiac structures.
IMPRESSION: 1. Coronary calcium score of 0.

2. Normal coronary origin with right dominance.

3. Minimal non-calcified plaque (<25%) in the LCX/RCA.

4. Small PFO.

RECOMMENDATIONS:
1. Minimal non-obstructive CAD (0-24%). Consider non-atherosclerotic
causes of chest pain. Consider preventive therapy and risk factor
modification.

*** End of Addendum ***
EXAM:
OVER-READ INTERPRETATION  CT CHEST

The following report is a limited chest CT over-read performed by
06/01/2021. This over-read does not include interpretation of cardiac
or coronary anatomy or pathology. The coronary calcium
score/coronary CTA interpretation by the cardiologist is attached.
FINDINGS: Vascular: There are no significant vascular findings.

Mediastinum/Nodes: There are no enlarged lymph nodes.The visualized
esophagus demonstrates no significant findings.

Lungs/Pleura: Clear lungs. No pneumothorax or pleural effusion.

Upper abdomen: No acute abnormality.

Musculoskeletal/Chest wall: No chest wall abnormality. No acute or
significant osseous findings.
IMPRESSION: 1. No significant extracardiac findings.

## 2022-10-26 ENCOUNTER — Ambulatory Visit (INDEPENDENT_AMBULATORY_CARE_PROVIDER_SITE_OTHER): Payer: Self-pay | Admitting: Cardiovascular Disease

## 2022-10-26 ENCOUNTER — Encounter (HOSPITAL_BASED_OUTPATIENT_CLINIC_OR_DEPARTMENT_OTHER): Payer: Self-pay | Admitting: Cardiovascular Disease

## 2022-10-26 ENCOUNTER — Other Ambulatory Visit (HOSPITAL_BASED_OUTPATIENT_CLINIC_OR_DEPARTMENT_OTHER): Payer: Self-pay

## 2022-10-26 ENCOUNTER — Telehealth: Payer: Self-pay | Admitting: Licensed Clinical Social Worker

## 2022-10-26 VITALS — BP 146/90 | HR 82 | Ht 67.0 in | Wt 265.2 lb

## 2022-10-26 DIAGNOSIS — R7303 Prediabetes: Secondary | ICD-10-CM

## 2022-10-26 DIAGNOSIS — I421 Obstructive hypertrophic cardiomyopathy: Secondary | ICD-10-CM

## 2022-10-26 DIAGNOSIS — N179 Acute kidney failure, unspecified: Secondary | ICD-10-CM

## 2022-10-26 DIAGNOSIS — E669 Obesity, unspecified: Secondary | ICD-10-CM

## 2022-10-26 DIAGNOSIS — I10 Essential (primary) hypertension: Secondary | ICD-10-CM

## 2022-10-26 MED ORDER — VERAPAMIL HCL ER 120 MG PO TBCR
120.0000 mg | EXTENDED_RELEASE_TABLET | Freq: Every day | ORAL | 3 refills | Status: DC
  Filled 2022-10-26: qty 20, 20d supply, fill #0
  Filled 2022-10-26: qty 70, 70d supply, fill #0
  Filled 2023-01-25: qty 90, 90d supply, fill #1
  Filled 2023-05-02: qty 90, 90d supply, fill #2
  Filled 2023-07-27: qty 90, 90d supply, fill #3

## 2022-10-26 MED ORDER — HYDROCHLOROTHIAZIDE 25 MG PO TABS
25.0000 mg | ORAL_TABLET | Freq: Every day | ORAL | 3 refills | Status: DC
  Filled 2022-10-26: qty 90, 90d supply, fill #0
  Filled 2023-01-25: qty 90, 90d supply, fill #1
  Filled 2023-05-02: qty 90, 90d supply, fill #2
  Filled 2023-07-27: qty 90, 90d supply, fill #3

## 2022-10-26 NOTE — Telephone Encounter (Signed)
H&V Care Navigation CSW Progress Note  Clinical Social Worker  completed chart review  to verify if pt has coverage/assist as needed. No insurance card on file, but does have active pharmacy benefits. Available should pt need assistance in future.  Patient is participating in a Managed Medicaid Plan:  No, commercial plan  SDOH Screenings   Tobacco Use: Low Risk  (10/26/2022)    Octavio Graves, MSW, LCSW Clinical Social Worker II [facility] Heart/Vascular Care Navigation  [phone]- work cell phone (preferred) [phone]- desk phone

## 2022-10-26 NOTE — Progress Notes (Signed)
Cardiology Office Note:  .   Date:  10/26/2022  ID:  Judy [name], DOB 12/22/1965, MRN [id number] PCP: Quitman Livings, MD  [facility] Providers Cardiologist:  Chilton Si, MD    History of Present Illness: .   Judy [name] is a 57 y.o. female with HCM and hypertension who presents for follow up.  She was initially seen in the hospital 01/2017 with chest pain.  Cardiac enzymes were negative.  Her EKG was concerning for hypertrophic cardiomyopathy or LVH with repolarization abnormalities.  Her chest pain was thought to be atypical.  Her exam was consistent with HOCM.  During that hospitalization she had a short run of NSVT.  It was recommended that she get a cardiac MRI but this did not occur prior to discharge.  She was started on metoprolol.  She was last seen by Judy Shelter, PA-C on 06/2019.  At the time she has been out of her medications for over a month and felt poorly.  A repeat echocardiogram revealed LVEF 60 to 65% with mild asymmetric septal hypertrophy.  There was mild to moderate mitral regurgitation and grade 1 diastolic dysfunction. She has three daughters.  They all had echocardiograms when running track.  There is no report of HCM.  No family history of SCD.  She was feeling well but had some persistent dyspnea on exertion.  She wore an ambulatory monitor that showed up to 4 beats of NSVT and up to 8 beats of SVT.    We attempted to switch her from phentermine to Ozempic but it was denied by her insurance.  At her visit 01/2021 she was encouraged to work on increasing her exercise.  She also noted some fatigue.  We asked her to hold metoprolol to see if that was contributing.  She followed up with Judy [name] and was feeling much better.  She had a coronary CTA 05/2021 that revealed a calcium score of 0 and minimal nonobstructive plaque in the left circumflex and RCA.  She also had a small PFO.  Judy [name] presents with concerns of weight gain after discontinuing Wegovy due to  gastrointestinal upset. She reports low energy levels despite vitamin supplementation and decreased physical activity. She enjoys walking but notes chest tightness and skin swelling with exertion, particularly in warm temperatures. She prefers outdoor activity, finding gym environments too warm.  The patient also reports a sensation of chest tightness, which she describes as a swelling or tightening sensation, particularly with exertion or exposure to heat. She denies any lower extremity swelling or recent changes in blood pressure.  She has no orthopnea or PND. She had previously discontinued metoprolol due to fatigue but has since restarted it. She has been off hydrochlorothiazide for approximately five to six months.  She hasn't been checking her BP regularly.   The patient also reports issues with motivation for physical activity and exercise. She expresses a desire for increased energy and motivation. She denies any recent episodes of palpitations or racing heart. She reports feeling rested upon waking and denies excessive daytime sleepiness. She has not been contacted by the healthy weight and wellness program despite being on a waiting list. She also reports issues with diarrhea.      ROS: as per HPI  Studies Reviewed: Marland Kitchen   EKG Interpretation Date/Time:  Tuesday October 26 2022 08:25:17 EDT Ventricular Rate:  82 PR Interval:  144 QRS Duration:  92 QT Interval:  414 QTC Calculation: 483 R Axis:   -8  Text Interpretation: Normal sinus rhythm Minimal voltage criteria for LVH, may be normal variant ( Cornell product ) ST & T wave abnormality, consider lateral ischemia Prolonged QT No significant change since last tracing Confirmed by Chilton Si ([id number]) on 10/26/2022 8:37:14 AM   Coronary CT-A 05/2021: IMPRESSION: 1. Coronary calcium score of 0.   2. Normal coronary origin with right dominance.   3. Minimal non-calcified plaque (<25%) in the LCX/RCA.   4. Small PFO.    RECOMMENDATIONS: 1. Minimal non-obstructive CAD (0-24%). Consider non-atherosclerotic causes of chest pain. Consider preventive therapy and risk factor modification.    Risk Assessment/Calculations:     HYPERTENSION CONTROL Vitals:   10/26/22 0828 10/26/22 0922  BP: (!) 142/88 (!) 146/90    The patient's blood pressure is elevated above target today.  In order to address the patient's elevated BP: A new medication was prescribed today.          Physical Exam:   VS:  BP (!) 146/90   Pulse 82   Ht 5\' 7"  (1.702 m)   Wt 265 lb 3.2 oz (120.3 kg)   BMI 41.54 kg/m  , BMI Body mass index is 41.54 kg/m. GENERAL:  Well appearing HEENT: Pupils equal round and reactive, fundi not visualized, oral mucosa unremarkable NECK:  No jugular venous distention, waveform within normal limits, carotid upstroke brisk and symmetric, no bruits, no thyromegaly LUNGS:  Clear to auscultation bilaterally HEART:  RRR.  PMI not displaced or sustained,S1 and S2 within normal limits, no S3, no S4, no clicks, no rubs, II/VI systolic murmur at LUSB ABD:  Flat, positive bowel sounds normal in frequency in pitch, no bruits, no rebound, no guarding, no midline pulsatile mass, no hepatomegaly, no splenomegaly EXT:  2 plus pulses throughout, no edema, no cyanosis no clubbing SKIN:  No rashes no nodules NEURO:  Cranial nerves II through XII grossly intact, motor grossly intact throughout PSYCH:  Cognitively intact, oriented to person place and time   ASSESSMENT AND PLAN: .    # Hypertrophic Cardiomyopathy Asymptomatic septal hypertrophy on cardiac MRI in 2021.  Reports of chest tightness and fatigue, particularly with exertion and in warm environments. Last echocardiogram was a while ago and she has been inconsistently taking metoprolol. -Order repeat echocardiogram to assess for worsening of hypertrophic cardiomyopathy and to evaluate for outflow tract obstruction. -Discontinue metoprolol due to  patient-reported fatigue. -Start verapamil 120mg  daily as an alternative to metoprolol, with the aim of reducing symptoms and potentially improving exercise tolerance. -Refer to Dr. Lenor Derrick, a specialist in hypertrophic cardiomyopathy, for further evaluation and consideration of newer treatment options such as mavacamten (Camzyos).  # Morbid obesity:  # Weight Gain She reports weight gain after discontinuing Wegovy due to gastrointestinal side effects. She also reports low energy and decreased physical activity. -Reach out to Healthy Weight and Wellness program to facilitate her enrollment. -Encourage her to engage in regular physical activity, starting with short walks.  # Hypertension She has been off hydrochlorothiazide for several months. -Resume hydrochlorothiazide. -Switch metoprolol to verapamil as above -Advise her to monitor blood pressures at home and record them for review at next visit.  # Fatigue:  Previously improved after holding metoprolol.  She denies symptoms of OSA.  Low threshold for Itamar if symptoms don't resolve.  Follow-up in 6 months to assess response to changes in medication and progress in weight management.      Dispo: f/u 6 months  Signed, Chilton Si, MD

## 2022-10-26 NOTE — Patient Instructions (Addendum)
Medication Instructions:  STOP METOPROLOL   START VERAPAMIL 120 MG DAILY   *If you need a refill on your cardiac medications before your next appointment, please call your pharmacy*  Lab Work: NONE  Testing/Procedures: NONE  Follow-Up: At [facility], you and your health needs are our priority.  As part of our continuing mission to provide you with exceptional heart care, we have created designated Provider Care Teams.  These Care Teams include your primary Cardiologist (physician) and Advanced Practice Providers (APPs -  Physician Assistants and Nurse Practitioners) who all work together to provide you with the care you need, when you need it.  We recommend signing up for the patient portal called "MyChart".  Sign up information is provided on this After Visit Summary.  MyChart is used to connect with patients for Virtual Visits (Telemedicine).  Patients are able to view lab/test results, encounter notes, upcoming appointments, etc.  Non-urgent messages can be sent to your provider as well.   To learn more about what you can do with MyChart, go to [email or web address].    Your next appointment:   6 month(s)  The format for your next appointment:   In Person  Provider:   DR Francisco OR Ronn Melena NP   FOLLOW UP WITH Dr Lenor Derrick in 1 month   You have been referred to   Ambulatory referral to [facility] ([facility]) Where: [facility] at [facility] Address: [address] [place] [address] Phone: [phone]

## 2022-10-29 ENCOUNTER — Telehealth (HOSPITAL_BASED_OUTPATIENT_CLINIC_OR_DEPARTMENT_OTHER): Payer: Self-pay | Admitting: Cardiovascular Disease

## 2022-10-29 DIAGNOSIS — I421 Obstructive hypertrophic cardiomyopathy: Secondary | ICD-10-CM

## 2022-10-29 NOTE — Telephone Encounter (Signed)
Per Dr. Izora Ribas patient is needing an Echo prior to seeing him 11/21 for HOCM.   An order is in need of being placed for echo to be scheduled.  Please advise.

## 2022-10-29 NOTE — Telephone Encounter (Signed)
Echo ordered.

## 2022-10-29 NOTE — Telephone Encounter (Signed)
Routing to correct triage pool

## 2022-11-22 ENCOUNTER — Other Ambulatory Visit: Payer: Self-pay | Admitting: Cardiovascular Disease

## 2022-12-01 ENCOUNTER — Ambulatory Visit (HOSPITAL_COMMUNITY): Payer: Self-pay | Attending: Internal Medicine

## 2022-12-01 DIAGNOSIS — I421 Obstructive hypertrophic cardiomyopathy: Secondary | ICD-10-CM | POA: Insufficient documentation

## 2022-12-01 LAB — ECHOCARDIOGRAM COMPLETE
Area-P 1/2: 2.76 cm2
S' Lateral: 2.7 cm

## 2022-12-02 ENCOUNTER — Ambulatory Visit: Payer: Self-pay | Admitting: Internal Medicine

## 2022-12-28 ENCOUNTER — Emergency Department (HOSPITAL_COMMUNITY): Payer: No Typology Code available for payment source

## 2022-12-28 ENCOUNTER — Emergency Department (HOSPITAL_COMMUNITY)
Admission: EM | Admit: 2022-12-28 | Discharge: 2022-12-28 | Discharge status: Against Medical Advice | Payer: Self-pay | Source: Home / Self Care

## 2022-12-28 ENCOUNTER — Ambulatory Visit: Payer: Self-pay | Admitting: Internal Medicine

## 2022-12-28 ENCOUNTER — Emergency Department (HOSPITAL_COMMUNITY): Payer: Self-pay

## 2022-12-28 ENCOUNTER — Emergency Department (HOSPITAL_COMMUNITY)
Admission: EM | Admit: 2022-12-28 | Discharge: 2022-12-28 | Disposition: A | Discharge status: Home / Self Care | Payer: No Typology Code available for payment source | Attending: Emergency Medicine | Admitting: Emergency Medicine

## 2022-12-28 ENCOUNTER — Encounter (HOSPITAL_COMMUNITY): Payer: Self-pay | Admitting: Emergency Medicine

## 2022-12-28 DIAGNOSIS — M79605 Pain in left leg: Secondary | ICD-10-CM | POA: Insufficient documentation

## 2022-12-28 DIAGNOSIS — Z79899 Other long term (current) drug therapy: Secondary | ICD-10-CM | POA: Insufficient documentation

## 2022-12-28 DIAGNOSIS — R531 Weakness: Secondary | ICD-10-CM | POA: Diagnosis present

## 2022-12-28 DIAGNOSIS — I10 Essential (primary) hypertension: Secondary | ICD-10-CM | POA: Diagnosis not present

## 2022-12-28 DIAGNOSIS — R29898 Other symptoms and signs involving the musculoskeletal system: Secondary | ICD-10-CM

## 2022-12-28 LAB — CBC WITH DIFFERENTIAL/PLATELET
Abs Immature Granulocytes: 0.04 10*3/uL (ref 0.00–0.07)
Basophils Absolute: 0.1 10*3/uL (ref 0.0–0.1)
Basophils Relative: 0 %
Eosinophils Absolute: 0.1 10*3/uL (ref 0.0–0.5)
Eosinophils Relative: 1 %
HCT: 41.7 % (ref 36.0–46.0)
Hemoglobin: 13.9 g/dL (ref 12.0–15.0)
Immature Granulocytes: 0 %
Lymphocytes Relative: 23 %
Lymphs Abs: 3.7 10*3/uL (ref 0.7–4.0)
MCH: 31.4 pg (ref 26.0–34.0)
MCHC: 33.3 g/dL (ref 30.0–36.0)
MCV: 94.1 fL (ref 80.0–100.0)
Monocytes Absolute: 0.8 10*3/uL (ref 0.1–1.0)
Monocytes Relative: 5 %
Neutro Abs: 11 10*3/uL — ABNORMAL HIGH (ref 1.7–7.7)
Neutrophils Relative %: 71 %
Platelets: 373 10*3/uL (ref 150–400)
RBC: 4.43 MIL/uL (ref 3.87–5.11)
RDW: 14.6 % (ref 11.5–15.5)
WBC: 15.8 10*3/uL — ABNORMAL HIGH (ref 4.0–10.5)
nRBC: 0 % (ref 0.0–0.2)

## 2022-12-28 LAB — BASIC METABOLIC PANEL
Anion gap: 9 (ref 5–15)
BUN: 15 mg/dL (ref 6–20)
CO2: 29 mmol/L (ref 22–32)
Calcium: 9.2 mg/dL (ref 8.9–10.3)
Chloride: 97 mmol/L — ABNORMAL LOW (ref 98–111)
Creatinine, Ser: 0.95 mg/dL (ref 0.44–1.00)
GFR, Estimated: >60 mL/min (ref >60)
Glucose, Bld: 135 mg/dL — ABNORMAL HIGH (ref 70–99)
Potassium: 3.3 mmol/L — ABNORMAL LOW (ref 3.5–5.1)
Sodium: 135 mmol/L (ref 135–145)

## 2022-12-28 LAB — MAGNESIUM: Magnesium: 2.1 mg/dL (ref 1.7–2.4)

## 2022-12-28 LAB — SEDIMENTATION RATE: Sed Rate: 60 mm/h — ABNORMAL HIGH (ref 0–22)

## 2022-12-28 LAB — C-REACTIVE PROTEIN: CRP: 10.8 mg/dL — ABNORMAL HIGH (ref <1.0)

## 2022-12-28 MED ORDER — DEXAMETHASONE SODIUM PHOSPHATE 10 MG/ML IJ SOLN
10.0000 mg | Freq: Once | INTRAMUSCULAR | Status: AC
Start: 2022-12-28 — End: 2022-12-28
  Administered 2022-12-28: 10 mg via INTRAVENOUS
  Filled 2022-12-28: qty 1

## 2022-12-28 MED ORDER — PREDNISONE 10 MG (21) PO TBPK: ORAL_TABLET | Freq: Every day | ORAL | 0 refills | Status: DC

## 2022-12-28 MED ORDER — IOHEXOL 300 MG/ML  SOLN
100.0000 mL | Freq: Once | INTRAMUSCULAR | Status: AC | PRN
  Administered 2022-12-28: 100 mL via INTRAVENOUS

## 2022-12-28 MED ORDER — OXYCODONE-ACETAMINOPHEN 5-325 MG PO TABS
1.0000 | ORAL_TABLET | Freq: Once | ORAL | Status: AC
  Administered 2022-12-28: 1 via ORAL
  Filled 2022-12-28: qty 1

## 2022-12-28 MED ORDER — METHOCARBAMOL 500 MG PO TABS
1000.0000 mg | ORAL_TABLET | Freq: Once | ORAL | Status: AC
  Administered 2022-12-28: 1000 mg via ORAL
  Filled 2022-12-28: qty 2

## 2022-12-28 MED ORDER — OXYCODONE-ACETAMINOPHEN 5-325 MG PO TABS
1.0000 | ORAL_TABLET | Freq: Once | ORAL | Status: AC
Start: 2022-12-28 — End: 2022-12-28
  Administered 2022-12-28: 1 via ORAL
  Filled 2022-12-28: qty 1

## 2022-12-28 MED ORDER — GADOBUTROL 1 MMOL/ML IV SOLN
10.0000 mL | Freq: Once | INTRAVENOUS | Status: AC | PRN
  Administered 2022-12-28: 10 mL via INTRAVENOUS

## 2022-12-28 MED ORDER — OXYCODONE-ACETAMINOPHEN 5-325 MG PO TABS: 1.0000 | ORAL_TABLET | Freq: Three times a day (TID) | ORAL | 0 refills | Status: AC | PRN

## 2022-12-28 MED ORDER — ONDANSETRON 4 MG PO TBDP
4.0000 mg | ORAL_TABLET | Freq: Once | ORAL | Status: AC
  Administered 2022-12-28: 4 mg via ORAL
  Filled 2022-12-28: qty 1

## 2022-12-28 MED ORDER — METHOCARBAMOL 500 MG PO TABS
1000.0000 mg | ORAL_TABLET | Freq: Three times a day (TID) | ORAL | 0 refills | Status: AC | PRN
Start: 2022-12-28 — End: ?

## 2022-12-28 NOTE — ED Provider Notes (Signed)
[place] [facility]PARTMENT AT [facility] Provider Note   CSN: [id number] Arrival date & time: 12/28/22  0865     History  Chief Complaint  Patient presents with   Extremity Weakness    Judy [name] is a 57 y.o. female.   Extremity Weakness  Patient presents for left leg pain and weakness.  Medical history includes HOCM, HTN, prediabetes.  She denies any recent injuries.  5 days ago, she had onset of pain across the top of her left foot.  The following day, pain began radiating up her left leg.  She had associated weakness.  She has had difficulty putting any weight on her left leg due to the pain and weakness.  Pain is no longer in her left foot or lower leg.  She does continue to have pain in lateral aspect of left thigh as well as left buttock.  She continues to not be able to put weight on her leg.  She was scheduled to have a cardiology appointment today but came to the ED instead due to her difficulty with ambulation.  She has never had symptoms like this before.  For the past 2 days, she has had intermittent chills and nausea.  She has not had vomiting.  In the ED waiting room, she did receive a dose of Percocet which improved her pain mildly.  She subsequently had some nausea and was given some Zofran.  Nausea has resolved.  She denies any difficulty with urinating.  She will at times, experience numbness in her left ankle.     Home Medications Prior to Admission medications   Medication Sig Start Date End Date Taking? Authorizing Provider  methocarbamol (ROBAXIN) 500 MG tablet Take 2 tablets (1,000 mg total) by mouth every 8 (eight) hours as needed for muscle spasms. 12/28/22  Yes Gloris Manchester, MD  oxyCODONE-acetaminophen (PERCOCET/ROXICET) 5-325 MG tablet Take 1 tablet by mouth every 8 (eight) hours as needed for up to 5 days for severe pain (pain score 7-10). 12/28/22 01/02/23 Yes Gloris Manchester, MD  predniSONE (STERAPRED UNI-PAK 21 TAB) 10 MG (21) TBPK tablet  Take by mouth daily. Take 6 tabs by mouth daily  for 2 days, then 5 tabs for 2 days, then 4 tabs for 2 days, then 3 tabs for 2 days, 2 tabs for 2 days, then 1 tab by mouth daily for 2 days 12/28/22  Yes Gloris Manchester, MD  acetaminophen (TYLENOL) 650 MG CR tablet Take 650 mg by mouth 2 (two) times daily as needed for pain.    [provider]  Cholecalciferol (PA VITAMIN D-3 GUMMY PO) Take 2 each by mouth daily.    [provider]  hydrochlorothiazide (HYDRODIURIL) 25 MG tablet Take 1 tablet (25 mg total) by mouth daily. 10/26/22   Chilton Si, MD  Multiple Vitamin (MULTIVITAMIN) tablet Take 1 tablet by mouth daily.    [provider]  verapamil (CALAN-SR) 120 MG CR tablet Take 1 tablet (120 mg total) by mouth daily. 10/26/22   Chilton Si, MD      Allergies    Naproxen sodium    Review of Systems   Review of Systems  Constitutional:  Positive for chills.  Gastrointestinal:  Positive for nausea.  Musculoskeletal:  Positive for extremity weakness and myalgias.  Neurological:  Positive for weakness and numbness.  All other systems reviewed and are negative.   Physical Exam Updated Vital Signs BP (!) 138/93   Pulse 76   Temp 98.6 F (37  C) (Oral)   Resp 18   SpO2 99%  Physical Exam Vitals and nursing note reviewed.  Constitutional:      General: She is not in acute distress.    Appearance: Normal appearance. She is well-developed. She is not ill-appearing, toxic-appearing or diaphoretic.  HENT:     Head: Normocephalic and atraumatic.     Right Ear: External ear normal.     Left Ear: External ear normal.     Nose: Nose normal.     Mouth/Throat:     Mouth: Mucous membranes are moist.  Eyes:     Extraocular Movements: Extraocular movements intact.     Conjunctiva/sclera: Conjunctivae normal.  Cardiovascular:     Rate and Rhythm: Normal rate and regular rhythm.  Pulmonary:     Effort: Pulmonary effort is normal. No respiratory distress.   Abdominal:     General: There is no distension.     Palpations: Abdomen is soft.     Tenderness: There is no abdominal tenderness.  Musculoskeletal:        General: No swelling, tenderness, deformity or signs of injury.     Cervical back: Normal range of motion and neck supple.     Right lower leg: No edema.     Left lower leg: No edema.  Skin:    General: Skin is warm and dry.     Capillary Refill: Capillary refill takes less than 2 seconds.     Coloration: Skin is not jaundiced or pale.  Neurological:     Mental Status: She is alert and oriented to person, place, and time.     Cranial Nerves: No cranial nerve deficit.     Sensory: Sensory deficit present.     Motor: Weakness present.     Coordination: Coordination normal.  Psychiatric:        Mood and Affect: Mood normal.        Behavior: Behavior normal.     ED Results / Procedures / Treatments   Labs (all labs ordered are listed, but only abnormal results are displayed) Labs Reviewed  BASIC METABOLIC PANEL - Abnormal; Notable for the following components:      Result Value   Potassium 3.3 (*)    Chloride 97 (*)    Glucose, Bld 135 (*)    All other components within normal limits  CBC WITH DIFFERENTIAL/PLATELET - Abnormal; Notable for the following components:   WBC 15.8 (*)    Neutro Abs 11.0 (*)    All other components within normal limits  SEDIMENTATION RATE - Abnormal; Notable for the following components:   Sed Rate 60 (*)    All other components within normal limits  C-REACTIVE PROTEIN - Abnormal; Notable for the following components:   CRP 10.8 (*)    All other components within normal limits  MAGNESIUM    EKG None  Radiology MR Lumbar Spine W Wo Contrast Result Date: 12/28/2022 CLINICAL DATA:  Back pain, numbness in foot and leg, nausea EXAM: MRI THORACIC AND LUMBAR SPINE WITHOUT AND WITH CONTRAST TECHNIQUE: Multiplanar and multiecho pulse sequences of the thoracic and lumbar spine were obtained  without and with intravenous contrast. CONTRAST:  10mL GADAVIST GADOBUTROL 1 MMOL/ML IV SOLN COMPARISON:  No prior MRI of the thoracic or lumbar spine available, correlation is made with 10/15/2021 CT abdomen pelvis FINDINGS: MRI THORACIC SPINE FINDINGS Alignment:  No listhesis. Vertebrae: No acute fracture, evidence of discitis, or suspicious osseous lesion. No abnormal enhancement. Cord:  Normal signal and morphology.  No abnormal enhancement. Paraspinal and other soft tissues: Negative. Disc levels: Mild degenerative changes, with small disc bulges or protrusions, but no significant spinal canal stenosis or neural foraminal narrowing. MRI LUMBAR SPINE FINDINGS Segmentation:  5 lumbar type vertebral bodies. Alignment: No listhesis. Preservation of the normal lumbar lordosis. Vertebrae: No acute fracture, evidence of discitis, or suspicious osseous lesion. No abnormal enhancement. Degenerative changes in the sacroiliac joints. Conus medullaris: Extends to the L1 level and appears normal. No abnormal enhancement. Paraspinal and other soft tissues: Fibroid uterus. No lymphadenopathy. Disc levels: T12-L1: No significant disc bulge. Mild facet arthropathy. No spinal canal stenosis or neural foraminal narrowing. L1-L2: No significant disc bulge. No spinal canal stenosis or neural foraminal narrowing. L2-L3: No significant disc bulge. No spinal canal stenosis or neural foraminal narrowing. L3-L4: Minimal disc bulge. Mild facet arthropathy. No spinal canal stenosis. Mild bilateral neural foraminal narrowing. L4-L5: No significant disc bulge. Moderate facet arthropathy. Narrowing of the lateral recesses. No spinal canal stenosis. Mild bilateral neural foraminal narrowing. L5-S1: No significant disc bulge. Mild facet arthropathy. No spinal canal stenosis or neural foraminal narrowing. IMPRESSION: 1. No acute abnormality in the thoracic or lumbar spine. No abnormal enhancement. 2. L3-L4 and L4-L5 mild bilateral neural  foraminal narrowing. 3. Narrowing of the lateral recesses at L4-L5 could affect the descending L5 nerve roots. 4. No spinal canal stenosis. Electronically Signed   By: Wiliam Ke M.D.   On: 12/28/2022 11:46   MR THORACIC SPINE W WO CONTRAST Result Date: 12/28/2022 CLINICAL DATA:  Back pain, numbness in foot and leg, nausea EXAM: MRI THORACIC AND LUMBAR SPINE WITHOUT AND WITH CONTRAST TECHNIQUE: Multiplanar and multiecho pulse sequences of the thoracic and lumbar spine were obtained without and with intravenous contrast. CONTRAST:  10mL GADAVIST GADOBUTROL 1 MMOL/ML IV SOLN COMPARISON:  No prior MRI of the thoracic or lumbar spine available, correlation is made with 10/15/2021 CT abdomen pelvis FINDINGS: MRI THORACIC SPINE FINDINGS Alignment:  No listhesis. Vertebrae: No acute fracture, evidence of discitis, or suspicious osseous lesion. No abnormal enhancement. Cord:  Normal signal and morphology.  No abnormal enhancement. Paraspinal and other soft tissues: Negative. Disc levels: Mild degenerative changes, with small disc bulges or protrusions, but no significant spinal canal stenosis or neural foraminal narrowing. MRI LUMBAR SPINE FINDINGS Segmentation:  5 lumbar type vertebral bodies. Alignment: No listhesis. Preservation of the normal lumbar lordosis. Vertebrae: No acute fracture, evidence of discitis, or suspicious osseous lesion. No abnormal enhancement. Degenerative changes in the sacroiliac joints. Conus medullaris: Extends to the L1 level and appears normal. No abnormal enhancement. Paraspinal and other soft tissues: Fibroid uterus. No lymphadenopathy. Disc levels: T12-L1: No significant disc bulge. Mild facet arthropathy. No spinal canal stenosis or neural foraminal narrowing. L1-L2: No significant disc bulge. No spinal canal stenosis or neural foraminal narrowing. L2-L3: No significant disc bulge. No spinal canal stenosis or neural foraminal narrowing. L3-L4: Minimal disc bulge. Mild facet  arthropathy. No spinal canal stenosis. Mild bilateral neural foraminal narrowing. L4-L5: No significant disc bulge. Moderate facet arthropathy. Narrowing of the lateral recesses. No spinal canal stenosis. Mild bilateral neural foraminal narrowing. L5-S1: No significant disc bulge. Mild facet arthropathy. No spinal canal stenosis or neural foraminal narrowing. IMPRESSION: 1. No acute abnormality in the thoracic or lumbar spine. No abnormal enhancement. 2. L3-L4 and L4-L5 mild bilateral neural foraminal narrowing. 3. Narrowing of the lateral recesses at L4-L5 could affect the descending L5 nerve roots. 4. No spinal canal stenosis. Electronically Signed   By: Wiliam Ke  M.D.   On: 12/28/2022 11:46   DG Lumbar Spine Complete Result Date: 12/28/2022 CLINICAL DATA:  Back pain. 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 EXAM: LUMBAR SPINE - COMPLETE 4 VIEW COMPARISON:  None Available. FINDINGS: There is no evidence of lumbar spine fracture. Alignment is normal. Degenerative facet spurring throughout the lumbar spine, greatest at L3-4 and below. Maintained disc height. IMPRESSION: 1. No acute finding. 2. Diffuse lumbar degenerative facet spurring. Electronically Signed   By: Tiburcio Pea M.D.   On: 12/28/2022 06:19    Procedures Procedures    Medications Ordered in ED Medications  oxyCODONE-acetaminophen (PERCOCET/ROXICET) 5-325 MG per tablet 1 tablet (1 tablet Oral Given 12/28/22 0341)  ondansetron (ZOFRAN-ODT) disintegrating tablet 4 mg (4 mg Oral Given 12/28/22 0502)  methocarbamol (ROBAXIN) tablet 1,000 mg (1,000 mg Oral Given 12/28/22 0748)  gadobutrol (GADAVIST) 1 MMOL/ML injection 10 mL (10 mLs Intravenous Contrast Given 12/28/22 1040)  oxyCODONE-acetaminophen (PERCOCET/ROXICET) 5-325 MG per tablet 1 tablet (1 tablet Oral Given 12/28/22 1157)  dexamethasone (DECADRON) injection 10 mg (10 mg Intravenous Given 12/28/22 1315)    ED Course/ Medical Decision Making/ A&P                                  Medical Decision Making Amount and/or Complexity of Data Reviewed Labs: ordered. Radiology: ordered.  Risk Prescription drug management.   This patient presents to the ED for concern of right leg pain and weakness, this involves an extensive number of treatment options, and is a complaint that carries with it a high risk of complications and morbidity.  The differential diagnosis includes sciatica, spinal cord compression, peripheral nerve compression   Co morbidities that complicate the patient evaluation  HOCM, HTN, prediabetes   Additional history obtained:  Additional history obtained from N/A External records from outside source obtained and reviewed including EMR   Lab Tests:  I Ordered, and personally interpreted labs.  The pertinent results include: Leukocytosis is present.  ESR is age-adjusted normal, however, CRP is elevated.  Kidney function is normal.  Mild hypokalemia is present with otherwise normal electrolytes.   Imaging Studies ordered:  I ordered imaging studies including lumbar spine x-ray, MRI of thoracic and lumbar spine, left hip x-ray, CT pelvis I independently visualized and interpreted imaging which showed no acute findings on x-rays or MRIs.  CT scan pending at time of signout. I agree with the radiologist interpretation   Cardiac Monitoring: / EKG:  The patient was maintained on a cardiac monitor.  I personally viewed and interpreted the cardiac monitored which showed an underlying rhythm of: Sinus rhythm  Problem List / ED Course / Critical interventions / Medication management  Patient presenting for left leg pain and weakness.  Onset was 5 days ago.  Pain has migrated from distal lower extremity to proximal lower extremity.  Prior to being bedded in the ED, workup was initiated.  Lab work is notable for leukocytosis.  Of note, she has had some recent intermittent chills and nausea.  On exam, she does have proximal weakness of  her left leg.  She has diminished sensation in distal left leg, compared to the right.  She has no spinal tenderness.  Additional pain medication was ordered.  Patient to undergo MRI.  MRIs did not show acute findings.  She does have mild areas of recess stenoses.  He was given some Decadron while in the ED.  Plan will be  for steroid taper for his likely sciatica.  Her lab work was notable for some hypokalemia and elevated CRP.  Given her recent chills, leukocytosis, and elevated CRP, CT scan of pelvic area was ordered to assess for possible soft tissue infection.  Care of patient was signed out to oncoming ED provider. I ordered medication including Percocet and Robaxin for analgesia; Decadron for sciatica pain; Zofran for nausea Reevaluation of the patient after these medicines showed that the patient improved I have reviewed the patients home medicines and have made adjustments as needed   Social Determinants of Health:  Has PCP        Final Clinical Impression(s) / ED Diagnoses Final diagnoses:  Left leg weakness    Rx / DC Orders ED Discharge Orders          Ordered    predniSONE (STERAPRED UNI-PAK 21 TAB) 10 MG (21) TBPK tablet  Daily        12/28/22 1505    oxyCODONE-acetaminophen (PERCOCET/ROXICET) 5-325 MG tablet  Every 8 hours PRN        12/28/22 1505    methocarbamol (ROBAXIN) 500 MG tablet  Every 8 hours PRN        12/28/22 1505              Gloris Manchester, MD 12/28/22 1505

## 2022-12-28 NOTE — ED Provider Notes (Signed)
Care of patient received from prior provider at 3:30 PM, please see their note for complete H/P and care plan.  Received handoff per ED course.  Clinical Course as of 12/28/22 1622  Tue Dec 28, 2022  1529 Stable left hip pain.  Negative objective workup. CT Pelvis still pending. Positive inflamatory markers. DC if CTP is negative. [CC]    Clinical Course User Index [CC] Glyn Ade, MD    Reassessment: Patient's history of present illness and physical exam findings do not reveal any obvious pathology.  Studies all negative.  Per prior provider, plan for discharge outpatient follow-up.  Inflammatory markers nonspecific may have underlying disease that will need to be followed up with PCP.  No acute distress patient feels comfortable outpatient care and management.       Glyn Ade, MD 12/28/22 2016

## 2022-12-28 NOTE — ED Triage Notes (Addendum)
PT began experiencing numbness in foot and leg radiating to leg and buttocks. States she also experienced some nausea that day. She denies chest pain, sob, headache. Pt unable to hold up R leg against gravity.

## 2022-12-28 NOTE — ED Provider Triage Note (Signed)
Emergency Medicine Provider Triage Evaluation Note  Judy [name] , a 57 y.o. female  was evaluated in triage.  Pt complains of leg pain.  Left lateral leg pain from buttocks to toes that started on Friday or Saturday.  Has associated pins-and-needles, weakness and numbness secondary to pain.  No reported injuries.  No fevers..  Review of Systems  Positive: Pain, weakness, numbness Negative: Fevers, incontinence, dysuria, abdominal pain  Physical Exam  BP (!) 118/103 (BP Location: Right Arm)   Pulse 88   Temp 98.2 F (36.8 C) (Oral)   Resp 19   SpO2 97%  Gen:   Awake, no distress   Resp:  Normal effort  MSK:    2+ DP pulses.  Pain limits range of motion or strength testing.  No tenderness to palpation to the left leg.    Medical Decision Making  Medically screening exam initiated at 3:48 AM.  Appropriate orders placed.  Judy [name] was informed that the remainder of the evaluation will be completed by another provider, this initial triage assessment does not replace that evaluation, and the importance of remaining in the ED until their evaluation is complete.     Tilden Fossa, MD 12/28/22 [phone]

## 2022-12-28 NOTE — Discharge Instructions (Addendum)
Prescriptions were sent to your pharmacy.  Prednisone is a steroid to reduce inflammation and hopefully relieve your nerve pain.  Take in tapering dose as prescribed.  Percocet is a narcotic pain medication.  Take only as needed.  Methocarbamol is a muscle relaxer.  Take this as needed as well.  Follow-up with your primary care doctor.  Return to the emergency department for any new or worsening symptoms of concern.

## 2022-12-28 NOTE — ED Notes (Signed)
Patient transported to X-ray 

## 2022-12-28 NOTE — Progress Notes (Deleted)
Cardiology Office Note:    Date:  12/28/2022   ID:  Macario Carls, DOB [birth year], MRN [id number]  PCP:  Quitman Livings, MD   [facility] Providers Cardiologist:  Chilton Si, MD { Click to update primary MD,subspecialty MD or APP then REFRESH:1}    Referring MD: Quitman Livings, MD   CC: *** Consulted for the evaluation of HCM at the behest of Dr. Duke Salvia   History of Present Illness:    Judy [name] is a 57 y.o. female with a hx of HCM (no resting gradient, 20 mm septum, 2% LGE with mild to moderate MR).  She presents to establish care.  Had HTN, Aortic atherosclerosis.  Judy [name]   Past Medical History:  Diagnosis Date   Aortic atherosclerosis (HCC)    Diverticulitis    Essential hypertension 06/14/2019   HOCM (hypertrophic obstructive cardiomyopathy) (HCC) 02/04/2017   Hypertension    Obesity (BMI 30-39.9) 08/21/2019   Pre-diabetes 01/23/2021    Past Surgical History:  Procedure Laterality Date   BARTHOLIN CYST MARSUPIALIZATION     CESAREAN SECTION     X3 with BTL   DILATATION & CURETTAGE/HYSTEROSCOPY WITH TRUECLEAR N/A 04/12/2013   Procedure: DILATATION & CURETTAGE/HYSTEROSCOPY ;  Surgeon: Dara Lords, MD;  Location: [facility];  Service: Gynecology;  Laterality: N/A;   TUBAL LIGATION      Current Medications: No outpatient medications have been marked as taking for the 12/28/22 encounter (Appointment) with Christell Constant, MD.     Allergies:   Naproxen sodium   Social History   Socioeconomic History   Marital status: Single    Spouse name: Not on file   Number of children: Not on file   Years of education: Not on file   Highest education level: Not on file  Occupational History   Not on file  Tobacco Use   Smoking status: Never   Smokeless tobacco: Never  Vaping Use   Vaping status: Never Used  Substance and Sexual Activity   Alcohol use: Yes    Alcohol/week: 0.0 standard drinks of alcohol    Comment: social   Drug use:  No   Sexual activity: Yes    Birth control/protection: Surgical    Comment: BTL-1st intercourse 57 yo-Fewer than 5 partners  Other Topics Concern   Not on file  Social History Narrative   Not on file   Social Drivers of Health   Financial Resource Strain: Not on file  Food Insecurity: Not on file  Transportation Needs: Not on file  Physical Activity: Not on file  Stress: Not on file  Social Connections: Not on file     Family History: The patient's ***family history includes Diabetes in her father; Heart attack in her sister; Heart disease in her mother. There is no history of Colon polyps, Colon cancer, Esophageal cancer, Rectal cancer, or Stomach cancer.  ROS:   Please see the history of present illness.    *** All other systems reviewed and are negative.  EKGs/Labs/Other Studies Reviewed:    The following studies were reviewed today: ***      Recent Labs: 12/28/2022: BUN 15; Creatinine, Ser 0.95; Hemoglobin 13.9; Magnesium 2.1; Platelets 373; Potassium 3.3; Sodium 135  Recent Lipid Panel    Component Value Date/Time   CHOL 185 04/15/2021 1019   TRIG 91 04/15/2021 1019   HDL 45 04/15/2021 1019   CHOLHDL 4.1 04/15/2021 1019   CHOLHDL 3.6 08/03/2017 1720   VLDL 26 02/04/2017 0301   LDLCALC  123 (H) 04/15/2021 1019   LDLCALC 129 (H) 08/03/2017 1720    Cardiac Studies & Procedures     STRESS TESTS  ECHOCARDIOGRAM STRESS TEST 02/04/2017  Interpretation Summary *[place]* *[facility]* [address] [place], [place] [address] [phone]  ------------------------------------------------------------------- Stress Echocardiography  Patient:    Judy [name], Judy [name] MR #:       [id number] Study Date: 02/04/2017 Gender:     F Age:        51 Height: Weight: BSA: Pt. Status: Room:       6E09C  SONOGRAPHER  Lavenia Atlas, RCS ADMITTING    Lorretta Harp [id number] ATTENDING    Ward, Layla Maw PERFORMING   [facility], Inpatient ORDERING     Chilton Si, MD  cc: Chilton Si  -------------------------------------------------------------------  ------------------------------------------------------------------- Indications:      (425.11).  ------------------------------------------------------------------- History:   PMH:   Dyspnea.  Hypertrophic cardiomyopathy.  ------------------------------------------------------------------- Study Conclusions  - Stress ECG conclusions: There were no stress arrhythmias or conduction abnormalities. The stress ECG was normal. The sensitivity of this test was limited by a failure to achieve target heart rate. - Staged echo: Findings are consistent with hypertrophic obstructive cardiomyopathy with a relatively mild inducible LVOT obstructive gradient. There is no evidence of exercise induced regional wall motion abnormalities. There is a small inducible (dynamic) LVOT gradient, maximum 36 mm Hg immediately after exercise.  Impressions:  - No evidence of exercise induced ischemia. Findings are consistent with hypertrophic obstructive cardiomyopathy with a relatively mild inducible LVOT obstructive gradient.  ------------------------------------------------------------------- Study data:   Study status:  Routine.  Consent:  The risks, benefits, and alternatives to the procedure were explained to the patient and informed consent was obtained.  Procedure:  Initial setup. The patient was brought to the laboratory. A baseline ECG was recorded. Surface ECG leads and automatic cuff blood pressure measurements were monitored. Treadmill exercise testing was performed using the modified Bruce protocol. Exercise was terminated due to protocol completion. Transthoracic stress echocardiography for assessment of outflow obstruction. Image quality was adequate. Images were captured at baseline and peak exercise.  Study completion:  The patient tolerated the procedure well. There were no  complications.          Modified Bruce protocol. Stress echocardiography.  Birthdate:  Patient birthdate: 19-Jan-1965.  Age:  Patient is 57 yr old.  Sex:  Gender: female. Blood pressure:     97/61  Patient status:  Inpatient.  Study date: Study date: 02/04/2017. Study time: 03:51 PM.  -------------------------------------------------------------------  ------------------------------------------------------------------- Stress protocol:  +--------+------------+--------+ !Stage   !BP (mmHg)   !Symptoms! +--------+------------+--------+ !Baseline!87/58 (68)  !None    ! +--------+------------+--------+ !Stage 3 !187/72 (110)![place]! +--------+------------+--------+  ------------------------------------------------------------------- Stress results:   Maximal heart rate during stress was 136 bpm (80% of maximal predicted heart rate). The maximal predicted heart rate was 169 bpm.The target heart rate was achieved. The heart rate response to stress was normal. There was a normal resting blood pressure with an appropriate response to stress.  The patient experienced no chest pain during stress.   Functional capacity was normal.  ------------------------------------------------------------------- Stress ECG:  There were no stress arrhythmias or conduction abnormalities.  The stress ECG was normal. The sensitivity of this test was limited by a failure to achieve target heart rate.  ------------------------------------------------------------------- Immediate post stress:  - LV size was reduced. - LV global systolic function was hyperdynamic. The estimated LV ejection fraction was 80%. - Normal wall motion; no LV regional wall motion abnormalities.  -------------------------------------------------------------------  Stress echo results:     Left ventricular ejection fraction was normal at rest and with stress. Findings are consistent with hypertrophic obstructive cardiomyopathy with  a relatively mild inducible LVOT obstructive gradient.  ------------------------------------------------------------------- Prepared and Electronically Authenticated by  Thurmon Fair, MD 2019-01-25T17:34:35  ECHOCARDIOGRAM  ECHOCARDIOGRAM COMPLETE 12/01/2022  Narrative ECHOCARDIOGRAM REPORT    Patient Name:   Judy [name] Date of Exam: 12/01/2022 Medical Rec #:  [id number]      Height:       67.0 in Accession #:    [id number]     Weight:       265.2 lb Date of Birth:  05-03-1965       BSA:          2.280 m Patient Age:    57 years       BP:           118/70 mmHg Patient Gender: F              HR:           62 bpm. Exam Location:  [address]  Procedure: 2D Echo, Cardiac Doppler, Color Doppler, 3D Echo and Strain Analysis  Indications:    I42.2 HOCM  History:        Patient has prior history of Echocardiogram examinations, most recent 07/03/2019. Abnormal ECG, Signs/Symptoms:Chest Pain; Risk Factors:Hypertension. Prediabetes. Obesity.  Sonographer:    Cathie Beams RCS Referring Phys: [id number] [facility] A Eileen Croswell  IMPRESSIONS   1. Left ventricular ejection fraction, by estimation, is 60 to 65%. The left ventricle has normal function. The left ventricle has no regional wall motion abnormalities. There is severe asymmetric left ventricular hypertrophy of the septal segment. No significant LV outflow tract gradient measured. Left ventricular diastolic parameters are consistent with Grade II diastolic dysfunction (pseudonormalization). The average left ventricular global longitudinal strain is -21.9 %. The global longitudinal strain is normal. 2. Right ventricular systolic function is normal. The right ventricular size is normal. There is normal pulmonary artery systolic pressure. The estimated right ventricular systolic pressure is 16.5 mmHg. 3. No mitral valve systolic anterior motion noted. The mitral valve is normal in structure. Mild mitral valve regurgitation. No  evidence of mitral stenosis. 4. The aortic valve is tricuspid. Aortic valve regurgitation is trivial. 5. The inferior vena cava is normal in size with greater than 50% respiratory variability, suggesting right atrial pressure of 3 mmHg.  FINDINGS Left Ventricle: Left ventricular ejection fraction, by estimation, is 60 to 65%. The left ventricle has normal function. The left ventricle has no regional wall motion abnormalities. The average left ventricular global longitudinal strain is -21.9 %. The global longitudinal strain is normal. The left ventricular internal cavity size was normal in size. There is severe asymmetric left ventricular hypertrophy of the septal segment. Left ventricular diastolic parameters are consistent with Grade II diastolic dysfunction (pseudonormalization).  Right Ventricle: The right ventricular size is normal. No increase in right ventricular wall thickness. Right ventricular systolic function is normal. There is normal pulmonary artery systolic pressure. The tricuspid regurgitant velocity is 1.84 m/s, and with an assumed right atrial pressure of 3 mmHg, the estimated right ventricular systolic pressure is 16.5 mmHg.  Left Atrium: Left atrial size was normal in size.  Right Atrium: Right atrial size was normal in size.  Pericardium: There is no evidence of pericardial effusion.  Mitral Valve: No mitral valve systolic anterior motion noted. The mitral valve is normal in structure. Mild mitral valve regurgitation. No evidence  of mitral valve stenosis.  Tricuspid Valve: The tricuspid valve is normal in structure. Tricuspid valve regurgitation is trivial.  Aortic Valve: The aortic valve is tricuspid. Aortic valve regurgitation is trivial.  Pulmonic Valve: The pulmonic valve was normal in structure. Pulmonic valve regurgitation is trivial.  Aorta: The aortic root is normal in size and structure.  Venous: The inferior vena cava is normal in size with greater than 50%  respiratory variability, suggesting right atrial pressure of 3 mmHg.  IAS/Shunts: No atrial level shunt detected by color flow Doppler.   LEFT VENTRICLE PLAX 2D LVIDd:         4.10 cm   Diastology LVIDs:         2.70 cm   LV e' medial:    5.98 cm/s LV PW:         1.50 cm   LV E/e' medial:  12.7 LV IVS:        1.60 cm   LV e' lateral:   10.20 cm/s LVOT diam:     2.10 cm   LV E/e' lateral: 7.5 LV SV:         81 LV SV Index:   36        2D Longitudinal Strain LVOT Area:     3.46 cm  2D Strain GLS Avg:     -21.9 %  3D Volume EF: 3D EF:        63 % LV EDV:       310 ml LV ESV:       115 ml LV SV:        195 ml  RIGHT VENTRICLE RV S prime:     11.60 cm/s TAPSE (M-mode): 2.5 cm RVSP:           16.5 mmHg  LEFT ATRIUM             Index        RIGHT ATRIUM           Index LA diam:        4.10 cm 1.80 cm/m   RA Pressure: 3.00 mmHg LA Vol (A2C):   40.9 ml 17.94 ml/m  RA Area:     13.80 cm LA Vol (A4C):   58.0 ml 25.44 ml/m  RA Volume:   26.10 ml  11.45 ml/m LA Biplane Vol: 51.2 ml 22.46 ml/m AORTIC VALVE LVOT Vmax:   99.00 cm/s LVOT Vmean:  60.200 cm/s LVOT VTI:    0.235 m  AORTA Ao Root diam: 3.10 cm Ao Asc diam:  3.40 cm  MITRAL VALVE               TRICUSPID VALVE MV Area (PHT): 2.76 cm    TR Peak grad:   13.5 mmHg MV Decel Time: 275 msec    TR Vmax:        184.00 cm/s MV E velocity: 76.00 cm/s  Estimated RAP:  3.00 mmHg MV A velocity: 70.20 cm/s  RVSP:           16.5 mmHg MV E/A ratio:  1.08 SHUNTS Systemic VTI:  0.24 m Systemic Diam: 2.10 cm  Dalton McleanMD Electronically signed by Wilfred Lacy Signature Date/Time: 12/01/2022/5:00:07 PM    Final   MONITORS  LONG TERM MONITOR (3-14 DAYS) 10/07/2020  Narrative 3 Day Zio Monitor  Quality: Fair.  Baseline artifact. Predominant rhythm: sinus rhythm Average heart rate: 60 bpm Max heart rate: 130 bpm Min heart rate: 82 bpm Pauses >2.5 seconds: none  Up to 4 beats NSVT Up to 8 beats SVT Rare  PVCs  Tiffany C. Duke Salvia, MD, [facility] 10/07/2020 12:45 PM  CT SCANS  CT CORONARY MORPH W/CTA COR W/SCORE 06/01/2021  Addendum 06/01/2021  1:07 PM ADDENDUM REPORT: 06/01/2021 13:05  CLINICAL DATA:  Chest pain  EXAM: Cardiac/Coronary CTA  TECHNIQUE: A non-contrast, gated CT scan was obtained with axial slices of 3 mm through the heart for calcium scoring. Calcium scoring was performed using the Agatston method. A 110 kV prospective, gated, contrast cardiac scan was obtained. Gantry rotation speed was 250 msecs and collimation was 0.6 mm. Two sublingual nitroglycerin tablets (0.8 mg) were given. The 3D data set was reconstructed in 5% intervals of the 35-75% of the R-R cycle. Diastolic phases were analyzed on a dedicated workstation using MPR, MIP, and VRT modes. The patient received 95 cc of contrast.  FINDINGS: Image quality: Excellent.  Noise artifact is: Limited.  Coronary Arteries:  Normal coronary origin.  Right dominance.  Left main: The left main is a large caliber vessel with a normal take off from the left coronary cusp that bifurcates to form a left anterior descending artery and a left circumflex artery. There is no plaque or stenosis.  Left anterior descending artery: The LAD is patent without evidence of plaque or stenosis. The LAD gives off 2 patent diagonal branches.  Left circumflex artery: The LCX is non-dominant. There is minimal non-calcified plaque (<25%). The LCX gives off 2 patent obtuse marginal branches.  Right coronary artery: The RCA is dominant with normal take off from the right coronary cusp. There is minimal non-calcified plaque (<25%). The RCA terminates as a PDA and right posterolateral branch without evidence of plaque or stenosis.  Right Atrium: Right atrial size is within normal limits.  Right Ventricle: The right ventricular cavity is within normal limits.  Left Atrium: Left atrial size is normal in size with no left  atrial appendage filling defect. Small PFO.  Left Ventricle: The ventricular cavity size is within normal limits.  Pulmonary arteries: Normal in size without proximal filling defect.  Pulmonary veins: Normal pulmonary venous drainage.  Pericardium: Normal thickness without significant effusion or calcium present.  Cardiac valves: The aortic valve is trileaflet without significant calcification. The mitral valve is normal without significant calcification.  Aorta: Normal caliber without significant disease.  Extra-cardiac findings: See attached radiology report for non-cardiac structures.  IMPRESSION: 1. Coronary calcium score of 0.  2. Normal coronary origin with right dominance.  3. Minimal non-calcified plaque (<25%) in the LCX/RCA.  4. Small PFO.  RECOMMENDATIONS: 1. Minimal non-obstructive CAD (0-24%). Consider non-atherosclerotic causes of chest pain. Consider preventive therapy and risk factor modification.  Lennie Odor, MD   Electronically Signed By: Lennie Odor M.D. On: 06/01/2021 13:05  Narrative EXAM: OVER-READ INTERPRETATION  CT CHEST  The following report is a limited chest CT over-read performed by radiologist Dr. Malachi Pro of [facility] on 06/01/2021. This over-read does not include interpretation of cardiac or coronary anatomy or pathology. The coronary calcium score/coronary CTA interpretation by the cardiologist is attached.  COMPARISON:  None Available.  FINDINGS: Vascular: There are no significant vascular findings.  Mediastinum/Nodes: There are no enlarged lymph nodes.The visualized esophagus demonstrates no significant findings.  Lungs/Pleura: Clear lungs. No pneumothorax or pleural effusion.  Upper abdomen: No acute abnormality.  Musculoskeletal/Chest wall: No chest wall abnormality. No acute or significant osseous findings.  IMPRESSION: 1. No significant extracardiac findings.  Electronically  Signed: By: Vickki Hearing.D.  On: 06/01/2021 12:47  CARDIAC MRI  MR CARDIAC MORPHOLOGY W WO CONTRAST 10/08/2019  Narrative CLINICAL DATA:  HCM evaluation  EXAM: CARDIAC MRI  TECHNIQUE: The patient was scanned on a 1.5 Tesla Siemens magnet. A dedicated cardiac coil was used. Functional imaging was done using Fiesta sequences. 2,3, and 4 chamber views were done to assess for RWMA's. Modified Simpson's rule using a short axis stack was used to calculate an ejection fraction on a dedicated work [employer]. The patient received 10 cc of Gadavist. After 10 minutes inversion recovery sequences were used to assess for infiltration and scar tissue.  CONTRAST:  10 cc  of Gadavist  FINDINGS: Left ventricle:  -Asymmetric hypertrophy measuring up to 20mm in mid inferoseptum (6mm in posterior wall)  -Normal size  -Hyperdynamic systolic function  -Normal ECV (26%, using most recent Hct 45%)  -RV insertion site LGE. LGE accounts for 2% of total myocardial mass  LV EF:  70% (Normal 56-78%)  Absolute volumes:  LV EDV: (Normal 52-141 mL)  LV ESV: 49mL (Normal 13-51 mL)  LV SV: (Normal 33-97 mL)  CO: 6.3L/min (Normal 2.7-6.0 L/min)  Indexed volumes:  LV EDV: 74mL/sq-m (Normal 41-81 mL/sq-m)  LV ESV: 51mL/sq-m (Normal 12-21 mL/sq-m)  LV SV: 84mL/sq-m (Normal 26-56 mL/sq-m)  CI: 2.8L/min/sq-m (Normal 1.8-3.8 L/min/sq-m)  Right ventricle: Normal size and systolic function  RV EF: 66% (Normal 47-80%)  Absolute volumes:  RV EDV: (Normal 58-154 mL)  RV ESV: 53mL (Normal 12-68 mL)  RV SV: (Normal 35-98 mL)  CO: 5.9L/min (Normal 2.7-6 L/min)  Indexed volumes:  RV EDV: 24mL/sq-m (Normal 48-87 mL/sq-m)  RV ESV: 66mL/sq-m (Normal 11-28 mL/sq-m)  RV SV: 7mL/sq-m (Normal 27-57 mL/sq-m)  CI: 2.6L/min/sq-m (Normal 1.8-3.8 L/min/sq-m)  Left atrium: Moderate enlargement  Right atrium: Mild enlargement  Mitral valve:  Mild to moderate regurgitation (regurgitant fraction 20%)  Aortic valve: No regurgitation  Tricuspid valve: Mild regurgitation (regurgitant fraction 16%)  Pulmonic valve: No regurgitation  Aorta: Normal proximal ascending aorta  Pericardium: Normal  IMPRESSION: 1. Asymmetric LV hypertrophy measuring up to 20mm in mid inferoseptum (6mm in posterior wall), consistent with hypertrophic cardiomyopathy  2. RV insertion site late gadolinium enhancement, consistent with HCM. LGE accounts for 2% of total myocardial mass  3.  Normal LV size with hyperdynamic systolic function (EF 70%)  4.  Normal RV size and systolic function (EF 66%)  5.  Mild to moderate mitral regurgitation (regurgitant fraction 20%)  6.  Mild tricuspid regurgitation (regurgitant fraction 16%)   Electronically Signed By: Epifanio Lesches MD On: 10/08/2019 20:58          Physical Exam:    VS:  There were no vitals taken for this visit.    Wt Readings from Last 3 Encounters:  10/26/22 265 lb 3.2 oz (120.3 kg)  12/28/21 244 lb (110.7 kg)  10/15/21 235 lb (106.6 kg)     GEN: *** Well nourished, well developed in no acute distress HEENT: Normal NECK: No JVD; No carotid bruits LYMPHATICS: No lymphadenopathy CARDIAC: ***RRR, no murmurs, rubs, gallops RESPIRATORY:  Clear to auscultation without rales, wheezing or rhonchi  ABDOMEN: Soft, non-tender, non-distended MUSCULOSKELETAL:  No edema; No deformity  SKIN: Warm and dry NEUROLOGIC:  Alert and oriented x 3 PSYCHIATRIC:  Normal affect   ASSESSMENT:    No diagnosis found. PLAN:    Hypertrophic Cardiomyopathy*** vs *** - *** Variant - peak gradient *** on ***  - ***with/without MR, Apical Aneurysm, other considerations*** -  suspicion of Fabry's/Danon/Noonan's or other mimics of HCM: *** - Gene variant: *** - Indexed assessment: [email or web address].com/ - NYHA ***  - Non HCM Contributors to disease/status *** ***HTN- - (While note  showing secondary prevenitve remodeling INHERIT trial of losartan showed safety in patients with HCM).   Family history ***, Discussed family screening  *** Family 13-22, Family 22+, SCD in family, HCM in family - Family history and HTN: Will preferential start on valsartan ***  SCD  Assessment - Exercise testing normal for *** rhythm and *** peak gradient and *** no change in blood pressure or syncope on exercise testing) - Echo from *** notable for *** - CMR from *** notable for *** - *** 2 year assessment for VT on rhythm monitor *** - SCD risk estimated to be *** at 5 years SDM: we have discussed ***  Atrial fibrillation Assessment (***) - Atrial arrhythmia management: ***  Medication symptom plan - *** CCB, BB - Descalation of *** - Congestions/diuretics: *** - disopyramide *** - mavacamten consideration*** - clinical trial consideration: ***  ASA - age ***, MR ***, Redundant septal perforator anatomy - Alcohol septal ablation results in reduction of one or more NYHA class in approximately 90 percent of patients who undergo the procedure. The risks of alcohol septal ablation include a mortality risk of approximately 1 percent, a 10 percent risk of permanent pacemaker placement, and a 10 percent chance for the need for a repeat procedure due to suboptimal gradient reduction after the initial procedure. In patients who are not candidates for surgical myectomy or in patients who do not want to undergo cardiac surgery, alcohol septal ablation is the treatment of choice. Alcohol septal ablation is less invasive than surgical myectomy and its efficacy in reducing symptoms is similar to surgical myectomy (90 versus 96 percent have a reduction in symptoms with alcohol septal ablation and surgical myectomy, respectively).  Septal myectomy evaluation - other surgical indications: ***  Procedural/Surgical Clinical trial evaluation: ***  Genetic Therapy discussion:  ***  [email or web address]        {Are you ordering a CV Procedure (e.g. stress test, cath, DCCV, TEE, etc)?   Press F2        :[id number]}    Medication Adjustments/Labs and Tests Ordered: Current medicines are reviewed at length with the patient today.  Concerns regarding medicines are outlined above.  No orders of the defined types were placed in this encounter.  No orders of the defined types were placed in this encounter.   There are no Patient Instructions on file for this visit.   Signed, Christell Constant, MD  12/28/2022 8:47 AM    [facility]

## 2023-01-25 ENCOUNTER — Other Ambulatory Visit (HOSPITAL_BASED_OUTPATIENT_CLINIC_OR_DEPARTMENT_OTHER): Payer: Self-pay

## 2023-03-04 ENCOUNTER — Encounter (HOSPITAL_COMMUNITY): Payer: Self-pay

## 2023-03-04 ENCOUNTER — Emergency Department (HOSPITAL_COMMUNITY)
Admission: EM | Admit: 2023-03-04 | Discharge: 2023-03-04 | Disposition: A | Discharge status: Home / Self Care | Payer: No Typology Code available for payment source | Attending: Emergency Medicine | Admitting: Emergency Medicine

## 2023-03-04 ENCOUNTER — Other Ambulatory Visit: Payer: Self-pay

## 2023-03-04 ENCOUNTER — Emergency Department (HOSPITAL_COMMUNITY): Payer: No Typology Code available for payment source

## 2023-03-04 DIAGNOSIS — M109 Gout, unspecified: Secondary | ICD-10-CM

## 2023-03-04 DIAGNOSIS — M10071 Idiopathic gout, right ankle and foot: Secondary | ICD-10-CM | POA: Insufficient documentation

## 2023-03-04 DIAGNOSIS — M79671 Pain in right foot: Secondary | ICD-10-CM | POA: Diagnosis present

## 2023-03-04 LAB — CBC WITH DIFFERENTIAL/PLATELET
Abs Immature Granulocytes: 0.03 10*3/uL (ref 0.00–0.07)
Basophils Absolute: 0.1 10*3/uL (ref 0.0–0.1)
Basophils Relative: 0 %
Eosinophils Absolute: 0.2 10*3/uL (ref 0.0–0.5)
Eosinophils Relative: 1 %
HCT: 41.2 % (ref 36.0–46.0)
Hemoglobin: 13.7 g/dL (ref 12.0–15.0)
Immature Granulocytes: 0 %
Lymphocytes Relative: 39 %
Lymphs Abs: 4.7 10*3/uL — ABNORMAL HIGH (ref 0.7–4.0)
MCH: 31.1 pg (ref 26.0–34.0)
MCHC: 33.3 g/dL (ref 30.0–36.0)
MCV: 93.6 fL (ref 80.0–100.0)
Monocytes Absolute: 0.5 10*3/uL (ref 0.1–1.0)
Monocytes Relative: 4 %
Neutro Abs: 6.5 10*3/uL (ref 1.7–7.7)
Neutrophils Relative %: 56 %
Platelets: 450 10*3/uL — ABNORMAL HIGH (ref 150–400)
RBC: 4.4 MIL/uL (ref 3.87–5.11)
RDW: 13.9 % (ref 11.5–15.5)
WBC: 11.9 10*3/uL — ABNORMAL HIGH (ref 4.0–10.5)
nRBC: 0 % (ref 0.0–0.2)

## 2023-03-04 LAB — BASIC METABOLIC PANEL
Anion gap: 13 (ref 5–15)
BUN: 13 mg/dL (ref 6–20)
CO2: 30 mmol/L (ref 22–32)
Calcium: 9.8 mg/dL (ref 8.9–10.3)
Chloride: 97 mmol/L — ABNORMAL LOW (ref 98–111)
Creatinine, Ser: 1.02 mg/dL — ABNORMAL HIGH (ref 0.44–1.00)
GFR, Estimated: >60 mL/min (ref >60)
Glucose, Bld: 138 mg/dL — ABNORMAL HIGH (ref 70–99)
Potassium: 3.1 mmol/L — ABNORMAL LOW (ref 3.5–5.1)
Sodium: 140 mmol/L (ref 135–145)

## 2023-03-04 LAB — URIC ACID: Uric Acid, Serum: 10.4 mg/dL — ABNORMAL HIGH (ref 2.5–7.1)

## 2023-03-04 MED ORDER — HYDROCODONE-ACETAMINOPHEN 5-325 MG PO TABS: 1.0000 | ORAL_TABLET | ORAL | 0 refills | Status: DC | PRN

## 2023-03-04 MED ORDER — DEXAMETHASONE SODIUM PHOSPHATE 10 MG/ML IJ SOLN
10.0000 mg | Freq: Once | INTRAMUSCULAR | Status: AC
  Administered 2023-03-04: 10 mg via INTRAMUSCULAR
  Filled 2023-03-04: qty 1
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
## 2023-03-04 NOTE — ED Provider Triage Note (Signed)
Emergency Medicine Provider Triage Evaluation Note  Judy [name] , a 58 y.o. female  was evaluated in triage.  Pt complains of right great toe pain along with right foot pain for the past month.  Patient states she has gout but has no prior diagnosis of this.  Patient been taking Tylenol which seemed to help a little bit but pain is comes back.  Patient states that her right great toe is swollen and red.  Patient denies fevers.  Patient be able walk without issue.  Patient cannot move her joint due to the pain.  Review of Systems  Positive:  Negative:   Physical Exam  There were no vitals taken for this visit. Gen:   Awake, no distress   Resp:  Normal effort  MSK:   Right MTP joint is swollen and slightly erythematous, does not feel warm to palpation, unable to move due to pain Other:    Medical Decision Making  Medically screening exam initiated at 9:47 PM.  Appropriate orders placed.  NILANI HUGILL was informed that the remainder of the evaluation will be completed by another provider, this initial triage assessment does not replace that evaluation, and the importance of remaining in the ED until their evaluation is complete.  Workup initiated, suspect gout, labs and imaging ordered.   Netta Corrigan, PA-C 03/04/23 2148
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
## 2023-03-14 ENCOUNTER — Ambulatory Visit: Payer: Self-pay | Attending: Internal Medicine | Admitting: Internal Medicine

## 2023-03-14 ENCOUNTER — Ambulatory Visit: Payer: Self-pay | Admitting: Genetic Counselor

## 2023-03-14 ENCOUNTER — Encounter: Payer: Self-pay | Admitting: Internal Medicine

## 2023-03-14 VITALS — BP 122/72 | HR 81 | Ht 67.0 in | Wt 252.6 lb

## 2023-03-14 DIAGNOSIS — I272 Pulmonary hypertension, unspecified: Secondary | ICD-10-CM

## 2023-03-14 DIAGNOSIS — I421 Obstructive hypertrophic cardiomyopathy: Secondary | ICD-10-CM

## 2023-03-14 NOTE — Patient Instructions (Signed)
 Medication Instructions:  Your physician recommends that you continue on your current medications as directed. Please refer to the Current Medication list given to you today.  *If you need a refill on your cardiac medications before your next appointment, please call your pharmacy*   Lab Work: NONE  If you have labs (blood work) drawn today and your tests are completely normal, you will receive your results only by: MyChart Message (if you have MyChart) OR A paper copy in the mail If you have any lab test that is abnormal or we need to change your treatment, we will call you to review the results.   Testing/Procedures: Your physician has requested that you have a stress echocardiogram. For further information please visit [email or web address] Please follow instruction sheet as given.  Please note: We ask at that you not bring children with you during ultrasound (echo/ vascular) testing. Due to room size and safety concerns, children are not allowed in the ultrasound rooms during exams. Our front office staff cannot provide observation of children in our lobby area while testing is being conducted. An adult accompanying a patient to their appointment will only be allowed in the ultrasound room at the discretion of the ultrasound technician under special circumstances. We apologize for any inconvenience.    Follow-Up: At [facility], you and your health needs are our priority.  As part of our continuing mission to provide you with exceptional heart care, we have created designated Provider Care Teams.  These Care Teams include your primary Cardiologist (physician) and Advanced Practice Providers (APPs -  Physician Assistants and Nurse Practitioners) who all work together to provide you with the care you need, when you need it.   Your next appointment:   7 month(s)  Provider:   Riley Lam, MD

## 2023-03-14 NOTE — Progress Notes (Signed)
 Cardiology Office Note:  .    Date:  03/14/2023  ID:  Judy [name], DOB 1965/06/19, MRN [id number] PCP: Judy Livings, MD  [facility] Providers Cardiologist:  Judy Si, MD     CC: HCM Consulted for the evaluation of HCM at the behest of Dr. Duke [name]   History of Present Illness: .    Discussed the use of AI scribe software for clinical note transcription with the patient, who gave verbal consent to proceed.  Judy [name] "Judy [name]" is a 58 year old female with hypertrophic cardiomyopathy who presents with worsening symptoms of fatigue and chest discomfort. She was referred by Dr. Duke [name] for evaluation of her hypertrophic cardiomyopathy.  She experiences significant fatigue and chest discomfort, particularly during physical activities such as walking or going to the grocery store. She feels tired quickly and experiences chest tightness, which limits her ability to complete daily tasks. No episodes of syncope or presyncope.  Her symptoms have progressively worsened since 04-16-19, with a notable decline in her ability to perform physical activities. Symptoms became more pronounced around 06-05-2022to 04/15/2021, coinciding with a CT scan performed in 2021-06-15. She experiences chest tightness and fatigue even with minimal exertion, such as showering, and reports feeling overexerted easily.  Her mother had a heart murmur discovered in her sixties and passed away at 6 from a heart attack. There is no known family history of hypertrophic cardiomyopathy, but her mother had coronary artery disease. She has three daughters, all of whom have been screened for heart issues due to their involvement in sports. They underwent echocardiograms during their teenage years, with no reported abnormalities at the time.  She is currently unemployed and previously worked in Publishing rights manager, focusing on Estate agent tasks. Her daily activities are limited due to her symptoms, and she  primarily engages in light activities around the house.   Relevant histories: .  Social  - has three daughters, one at [place], one at [place]; all are due for screening, no prior genetic testing - sister passed from early MI/Valve disease - mother passed from CAD - she   ROS: As per HPI.   Studies Reviewed: .   Cardiac Studies & Procedures   ______________________________________________________________________________________________   STRESS TESTS  ECHOCARDIOGRAM STRESS TEST 02/04/2017  Interpretation Summary *[place]* *[facility]* [address] [place], [place] [address] [phone]  ------------------------------------------------------------------- Stress Echocardiography  Patient:    Judy [name] MR #:       [id number] Study Date: 02/04/2017 Gender:     F Age:        51 Height: Weight: BSA: Pt. Status: Room:       6E09C  SONOGRAPHER  Judy [name], RCS ADMITTING    Judy [name] [id number] ATTENDING    Judy [name] PERFORMING   [facility], Inpatient ORDERING     Judy Si, MD  cc: Judy [name]  -------------------------------------------------------------------  ------------------------------------------------------------------- Indications:      (425.11).  ------------------------------------------------------------------- History:   PMH:   Dyspnea.  Hypertrophic cardiomyopathy.  ------------------------------------------------------------------- Study Conclusions  - Stress ECG conclusions: There were no stress arrhythmias or conduction abnormalities. The stress ECG was normal. The sensitivity of this test was limited by a failure to achieve target heart rate. - Staged echo: Findings are consistent with hypertrophic obstructive cardiomyopathy with a relatively mild inducible LVOT obstructive gradient. There is no evidence of exercise induced regional wall motion abnormalities. There is a small inducible (dynamic)  LVOT gradient, maximum 36  mm Hg immediately after exercise.  Impressions:  - No evidence of exercise induced ischemia. Findings are consistent with hypertrophic obstructive cardiomyopathy with a relatively mild inducible LVOT obstructive gradient.  ------------------------------------------------------------------- Study data:   Study status:  Routine.  Consent:  The risks, benefits, and alternatives to the procedure were explained to the patient and informed consent was obtained.  Procedure:  Initial setup. The patient was brought to the laboratory. A baseline ECG was recorded. Surface ECG leads and automatic cuff blood pressure measurements were monitored. Treadmill exercise testing was performed using the modified Bruce protocol. Exercise was terminated due to protocol completion. Transthoracic stress echocardiography for assessment of outflow obstruction. Image quality was adequate. Images were captured at baseline and peak exercise.  Study completion:  The patient tolerated the procedure well. There were no complications.          Modified Bruce protocol. Stress echocardiography.  Birthdate:  Patient birthdate: [birth year].  Age:  Patient is 58 yr old.  Sex:  Gender: female. Blood pressure:     97/61  Patient status:  Inpatient.  Study date: Study date: 02/04/2017. Study time: 03:51 PM.  -------------------------------------------------------------------  ------------------------------------------------------------------- Stress protocol:  +--------+------------+--------+ !Stage   !BP (mmHg)   !Symptoms! +--------+------------+--------+ !Baseline!87/58 (68)  !None    ! +--------+------------+--------+ !Stage 3 !187/72 (110)![place]! +--------+------------+--------+  ------------------------------------------------------------------- Stress results:   Maximal heart rate during stress was 136 bpm (80% of maximal predicted heart rate). The maximal predicted heart  rate was 169 bpm.The target heart rate was achieved. The heart rate response to stress was normal. There was a normal resting blood pressure with an appropriate response to stress.  The patient experienced no chest pain during stress.   Functional capacity was normal.  ------------------------------------------------------------------- Stress ECG:  There were no stress arrhythmias or conduction abnormalities.  The stress ECG was normal. The sensitivity of this test was limited by a failure to achieve target heart rate.  ------------------------------------------------------------------- Immediate post stress:  - LV size was reduced. - LV global systolic function was hyperdynamic. The estimated LV ejection fraction was 80%. - Normal wall motion; no LV regional wall motion abnormalities.  ------------------------------------------------------------------- Stress echo results:     Left ventricular ejection fraction was normal at rest and with stress. Findings are consistent with hypertrophic obstructive cardiomyopathy with a relatively mild inducible LVOT obstructive gradient.  ------------------------------------------------------------------- Prepared and Electronically Authenticated by  Thurmon Fair, MD 2019-01-25T17:34:35   ECHOCARDIOGRAM  ECHOCARDIOGRAM COMPLETE 12/01/2022  Narrative ECHOCARDIOGRAM REPORT    Patient Name:   ALONDRIA MOUSSEAU Date of Exam: 12/01/2022 Medical Rec #:  [id number]      Height:       67.0 in Accession #:    [id number]     Weight:       265.2 lb Date of Birth:  06-Nov-1965       BSA:          2.280 m Patient Age:    57 years       BP:           118/70 mmHg Patient Gender: F              HR:           62 bpm. Exam Location:  [address]  Procedure: 2D Echo, Cardiac Doppler, Color Doppler, 3D Echo and Strain Analysis  Indications:    I42.2 HOCM  History:        Patient has prior history of Echocardiogram examinations, most recent  07/03/2019. Abnormal ECG,  Signs/Symptoms:Chest Pain; Risk Factors:Hypertension. Prediabetes. Obesity.  Sonographer:    Cathie Beams RCS Referring Phys: [id number] [facility] A Florita Nitsch  IMPRESSIONS   1. Left ventricular ejection fraction, by estimation, is 60 to 65%. The left ventricle has normal function. The left ventricle has no regional wall motion abnormalities. There is severe asymmetric left ventricular hypertrophy of the septal segment. No significant LV outflow tract gradient measured. Left ventricular diastolic parameters are consistent with Grade II diastolic dysfunction (pseudonormalization). The average left ventricular global longitudinal strain is -21.9 %. The global longitudinal strain is normal. 2. Right ventricular systolic function is normal. The right ventricular size is normal. There is normal pulmonary artery systolic pressure. The estimated right ventricular systolic pressure is 16.5 mmHg. 3. No mitral valve systolic anterior motion noted. The mitral valve is normal in structure. Mild mitral valve regurgitation. No evidence of mitral stenosis. 4. The aortic valve is tricuspid. Aortic valve regurgitation is trivial. 5. The inferior vena cava is normal in size with greater than 50% respiratory variability, suggesting right atrial pressure of 3 mmHg.  FINDINGS Left Ventricle: Left ventricular ejection fraction, by estimation, is 60 to 65%. The left ventricle has normal function. The left ventricle has no regional wall motion abnormalities. The average left ventricular global longitudinal strain is -21.9 %. The global longitudinal strain is normal. The left ventricular internal cavity size was normal in size. There is severe asymmetric left ventricular hypertrophy of the septal segment. Left ventricular diastolic parameters are consistent with Grade II diastolic dysfunction (pseudonormalization).  Right Ventricle: The right ventricular size is normal. No increase in right  ventricular wall thickness. Right ventricular systolic function is normal. There is normal pulmonary artery systolic pressure. The tricuspid regurgitant velocity is 1.84 m/s, and with an assumed right atrial pressure of 3 mmHg, the estimated right ventricular systolic pressure is 16.5 mmHg.  Left Atrium: Left atrial size was normal in size.  Right Atrium: Right atrial size was normal in size.  Pericardium: There is no evidence of pericardial effusion.  Mitral Valve: No mitral valve systolic anterior motion noted. The mitral valve is normal in structure. Mild mitral valve regurgitation. No evidence of mitral valve stenosis.  Tricuspid Valve: The tricuspid valve is normal in structure. Tricuspid valve regurgitation is trivial.  Aortic Valve: The aortic valve is tricuspid. Aortic valve regurgitation is trivial.  Pulmonic Valve: The pulmonic valve was normal in structure. Pulmonic valve regurgitation is trivial.  Aorta: The aortic root is normal in size and structure.  Venous: The inferior vena cava is normal in size with greater than 50% respiratory variability, suggesting right atrial pressure of 3 mmHg.  IAS/Shunts: No atrial level shunt detected by color flow Doppler.   LEFT VENTRICLE PLAX 2D LVIDd:         4.10 cm   Diastology LVIDs:         2.70 cm   LV e' medial:    5.98 cm/s LV PW:         1.50 cm   LV E/e' medial:  12.7 LV IVS:        1.60 cm   LV e' lateral:   10.20 cm/s LVOT diam:     2.10 cm   LV E/e' lateral: 7.5 LV SV:         81 LV SV Index:   36        2D Longitudinal Strain LVOT Area:     3.46 cm  2D Strain GLS Avg:     -21.9 %  3D Volume EF: 3D EF:        63 % LV EDV:       310 ml LV ESV:       115 ml LV SV:        195 ml  RIGHT VENTRICLE RV S prime:     11.60 cm/s TAPSE (M-mode): 2.5 cm RVSP:           16.5 mmHg  LEFT ATRIUM             Index        RIGHT ATRIUM           Index LA diam:        4.10 cm 1.80 cm/m   RA Pressure: 3.00 mmHg LA Vol (A2C):    40.9 ml 17.94 ml/m  RA Area:     13.80 cm LA Vol (A4C):   58.0 ml 25.44 ml/m  RA Volume:   26.10 ml  11.45 ml/m LA Biplane Vol: 51.2 ml 22.46 ml/m AORTIC VALVE LVOT Vmax:   99.00 cm/s LVOT Vmean:  60.200 cm/s LVOT VTI:    0.235 m  AORTA Ao Root diam: 3.10 cm Ao Asc diam:  3.40 cm  MITRAL VALVE               TRICUSPID VALVE MV Area (PHT): 2.76 cm    TR Peak grad:   13.5 mmHg MV Decel Time: 275 msec    TR Vmax:        184.00 cm/s MV E velocity: 76.00 cm/s  Estimated RAP:  3.00 mmHg MV A velocity: 70.20 cm/s  RVSP:           16.5 mmHg MV E/A ratio:  1.08 SHUNTS Systemic VTI:  0.24 m Systemic Diam: 2.10 cm  Dalton McleanMD Electronically signed by Wilfred Lacy Signature Date/Time: 12/01/2022/5:00:07 PM    Final    MONITORS  LONG TERM MONITOR (3-14 DAYS) 10/07/2020  Narrative 3 Day Zio Monitor  Quality: Fair.  Baseline artifact. Predominant rhythm: sinus rhythm Average heart rate: 60 bpm Max heart rate: 130 bpm Min heart rate: 82 bpm Pauses >2.5 seconds: none  Up to 4 beats NSVT Up to 8 beats SVT Rare PVCs  Tiffany C. Judy Salvia, MD, [facility] 10/07/2020 12:45 PM   CT SCANS  CT CORONARY MORPH W/CTA COR W/SCORE 06/01/2021  Addendum 06/01/2021  1:07 PM ADDENDUM REPORT: 06/01/2021 13:05  CLINICAL DATA:  Chest pain  EXAM: Cardiac/Coronary CTA  TECHNIQUE: A non-contrast, gated CT scan was obtained with axial slices of 3 mm through the heart for calcium scoring. Calcium scoring was performed using the Agatston method. A 110 kV prospective, gated, contrast cardiac scan was obtained. Gantry rotation speed was 250 msecs and collimation was 0.6 mm. Two sublingual nitroglycerin tablets (0.8 mg) were given. The 3D data set was reconstructed in 5% intervals of the 35-75% of the R-R cycle. Diastolic phases were analyzed on a dedicated workstation using MPR, MIP, and VRT modes. The patient received 95 cc of contrast.  FINDINGS: Image quality: Excellent.  Noise  artifact is: Limited.  Coronary Arteries:  Normal coronary origin.  Right dominance.  Left main: The left main is a large caliber vessel with a normal take off from the left coronary cusp that bifurcates to form a left anterior descending artery and a left circumflex artery. There is no plaque or stenosis.  Left anterior descending artery: The LAD is patent without evidence of plaque or stenosis. The LAD gives off 2 patent diagonal  branches.  Left circumflex artery: The LCX is non-dominant. There is minimal non-calcified plaque (<25%). The LCX gives off 2 patent obtuse marginal branches.  Right coronary artery: The RCA is dominant with normal take off from the right coronary cusp. There is minimal non-calcified plaque (<25%). The RCA terminates as a PDA and right posterolateral branch without evidence of plaque or stenosis.  Right Atrium: Right atrial size is within normal limits.  Right Ventricle: The right ventricular cavity is within normal limits.  Left Atrium: Left atrial size is normal in size with no left atrial appendage filling defect. Small PFO.  Left Ventricle: The ventricular cavity size is within normal limits.  Pulmonary arteries: Normal in size without proximal filling defect.  Pulmonary veins: Normal pulmonary venous drainage.  Pericardium: Normal thickness without significant effusion or calcium present.  Cardiac valves: The aortic valve is trileaflet without significant calcification. The mitral valve is normal without significant calcification.  Aorta: Normal caliber without significant disease.  Extra-cardiac findings: See attached radiology report for non-cardiac structures.  IMPRESSION: 1. Coronary calcium score of 0.  2. Normal coronary origin with right dominance.  3. Minimal non-calcified plaque (<25%) in the LCX/RCA.  4. Small PFO.  RECOMMENDATIONS: 1. Minimal non-obstructive CAD (0-24%). Consider non-atherosclerotic causes of chest  pain. Consider preventive therapy and risk factor modification.  Lennie Odor, MD   Electronically Signed By: Lennie Odor M.D. On: 06/01/2021 13:05  Narrative EXAM: OVER-READ INTERPRETATION  CT CHEST  The following report is a limited chest CT over-read performed by radiologist Dr. Malachi Pro of [facility] on 06/01/2021. This over-read does not include interpretation of cardiac or coronary anatomy or pathology. The coronary calcium score/coronary CTA interpretation by the cardiologist is attached.  COMPARISON:  None Available.  FINDINGS: Vascular: There are no significant vascular findings.  Mediastinum/Nodes: There are no enlarged lymph nodes.The visualized esophagus demonstrates no significant findings.  Lungs/Pleura: Clear lungs. No pneumothorax or pleural effusion.  Upper abdomen: No acute abnormality.  Musculoskeletal/Chest wall: No chest wall abnormality. No acute or significant osseous findings.  IMPRESSION: 1. No significant extracardiac findings.  Electronically Signed: By: Obie Dredge M.D. On: 06/01/2021 12:47   CARDIAC MRI  MR CARDIAC MORPHOLOGY W WO CONTRAST 10/08/2019  Narrative CLINICAL DATA:  HCM evaluation  EXAM: CARDIAC MRI  TECHNIQUE: The patient was scanned on a 1.5 Tesla Siemens magnet. A dedicated cardiac coil was used. Functional imaging was done using Fiesta sequences. 2,3, and 4 chamber views were done to assess for RWMA's. Modified Simpson's rule using a short axis stack was used to calculate an ejection fraction on a dedicated work [employer]. The patient received 10 cc of Gadavist. After 10 minutes inversion recovery sequences were used to assess for infiltration and scar tissue.  CONTRAST:  10 cc  of Gadavist  FINDINGS: Left ventricle:  -Asymmetric hypertrophy measuring up to 20mm in mid inferoseptum (6mm in posterior wall)  -Normal size  -Hyperdynamic systolic  function  -Normal ECV (26%, using most recent Hct 45%)  -RV insertion site LGE. LGE accounts for 2% of total myocardial mass  LV EF:  70% (Normal 56-78%)  Absolute volumes:  LV EDV: (Normal 52-141 mL)  LV ESV: 49mL (Normal 13-51 mL)  LV SV: (Normal 33-97 mL)  CO: 6.3L/min (Normal 2.7-6.0 L/min)  Indexed volumes:  LV EDV: 30mL/sq-m (Normal 41-81 mL/sq-m)  LV ESV: 56mL/sq-m (Normal 12-21 mL/sq-m)  LV SV: 8mL/sq-m (Normal 26-56 mL/sq-m)  CI: 2.8L/min/sq-m (Normal 1.8-3.8 L/min/sq-m)  Right  ventricle: Normal size and systolic function  RV EF: 66% (Normal 47-80%)  Absolute volumes:  RV EDV: (Normal 58-154 mL)  RV ESV: 53mL (Normal 12-68 mL)  RV SV: (Normal 35-98 mL)  CO: 5.9L/min (Normal 2.7-6 L/min)  Indexed volumes:  RV EDV: 70mL/sq-m (Normal 48-87 mL/sq-m)  RV ESV: 31mL/sq-m (Normal 11-28 mL/sq-m)  RV SV: 87mL/sq-m (Normal 27-57 mL/sq-m)  CI: 2.6L/min/sq-m (Normal 1.8-3.8 L/min/sq-m)  Left atrium: Moderate enlargement  Right atrium: Mild enlargement  Mitral valve: Mild to moderate regurgitation (regurgitant fraction 20%)  Aortic valve: No regurgitation  Tricuspid valve: Mild regurgitation (regurgitant fraction 16%)  Pulmonic valve: No regurgitation  Aorta: Normal proximal ascending aorta  Pericardium: Normal  IMPRESSION: 1. Asymmetric LV hypertrophy measuring up to 20mm in mid inferoseptum (6mm in posterior wall), consistent with hypertrophic cardiomyopathy  2. RV insertion site late gadolinium enhancement, consistent with HCM. LGE accounts for 2% of total myocardial mass  3.  Normal LV size with hyperdynamic systolic function (EF 70%)  4.  Normal RV size and systolic function (EF 66%)  5.  Mild to moderate mitral regurgitation (regurgitant fraction 20%)  6.  Mild tricuspid regurgitation (regurgitant fraction 16%)   Electronically Signed By: Epifanio Lesches MD On: 10/08/2019  20:58   ______________________________________________________________________________________________       Physical Exam:    VS:  BP 122/72   Pulse 81   Ht 5\' 7"  (1.702 m)   Wt 252 lb 9.6 oz (114.6 kg)   SpO2 97%   BMI 39.56 kg/m    Wt Readings from Last 3 Encounters:  03/14/23 252 lb 9.6 oz (114.6 kg)  03/04/23 245 lb (111.1 kg)  10/26/22 265 lb 3.2 oz (120.3 kg)    Gen: no distress, morbid obesity   Neck: No JVD Cardiac: No Rubs or Gallops, diastolic murmur,  holosystolic worse with standing, RRR +2 Respiratory: Clear to auscultation bilaterally, normal effort, normal  respiratory rate GI: Soft, nontender, non-distended  MS: No  edema (does note swelling in her hands);  moves all extremities Integument: Skin feels warm Neuro:  At time of evaluation, alert and oriented to person/place/time/situation  Psych: Normal affect, patient feels fair   ASSESSMENT AND PLAN: .    Hypertrophic Cardiomyopathy - Neural septal Variant, maximal septal thickness 22 mm (CCTA best diastolic) - peak gradient 36 in 2019 stress echo (NYHA I),   - with eccentric MR with colorsplay, mild AI - suspicion of Fabry's/Danon/Noonan's or other mimics of HCM: low - Gene variant: Pending - NYHA III - Biomarkers - pVO2: None Nonobstructive HCM with maximal septal thickness of 22 mm, symptoms of fatigue, chest tightness, and exertional dyspnea. No resting or Valsalva LVOT gradient. Family history includes maternal heart murmur and sister with CAD and triple bypass. Future FDA-approved medications for nonobstructive HCM anticipated in fall 2025. Stress echocardiogram with squat to stand protocol to assess inducible gradient and guide treatment- if severe gradient, we can postpone stress portion. Genetic testing for her and her daughters discussed, including impact on life insurance eligibility. - Order stress echocardiogram with squat to stand protocol - Refer to clinical genetics for genetic testing -  Schedule follow-up in October 2025 - Consider SGLT2 inhibitor if no inducible gradient  Family history , Discussed family screening  Genetic component discussed; referral to clinical geneticist recommended.  Recommended regular screenings for daughters due to genetic component of HCM. Serial echocardiograms every five years for daughters. - Recommend serial echocardiograms every five years for daughters - Discuss genetic testing options  with clinical geneticist  Risk of SCD at 5 years(%) 1.68 Recommendation Based on the absence of risk factors, this patient does not have an indication for an ICD (Class 3 - No Benefit) - Scar burden <22% - will get Stress Echo  Non HCM Contributors to disease/status: Morbid obesity and Pulmonary hypertension  Pulmonary Hypertension - Pulmonary hypertension likely contributing to symptoms. PA dilation ~ 32 mm. No evidence of pulmonary arterial hypertension. Weight impact discussed; treatment challenges due to diverticulitis. - Monitor symptoms and consider weight management if HCM treatment is insufficient  Morbid Obesity - Contributing to pulmonary hypertension and overall symptoms. Previous weight loss medications caused adverse effects on diverticulitis. Potential use of GLP-1 receptor agonists with dietary modifications discussed. - Consider weight management if HCM treatment is insufficient - Discuss GLP-1 receptor agonists with dietary modifications if needed  Diverticulitis Exacerbated by weight loss medications, leading to severe gastrointestinal symptoms. Currently well-managed. - Avoid weight loss medications that exacerbate diverticulitis - Monitor gastrointestinal symptoms  Follow-up - Follow-up appointment in October 2025 - Consider earlier follow-up if stress echocardiogram indicates significant obstruction.  Time Spent Directly with Patient:   I have spent a total of 68 minutes with the patient reviewing notes, imaging, EKGs, labs,  prior images shown to patient and examining the patient as well as establishing an assessment and plan that was discussed personally with the patient. Discussed disease state education genetic testing, novel theapeutic options. Reviewed care and plan in collaboration with clinica genetics    Riley Lam, MD FASE [facility] Cardiologist [facility]  [address] [place], [address] [place], [place] [address] [phone]  10:13 AM

## 2023-03-15 NOTE — Progress Notes (Signed)
 Pre Test Genetic Consult  Referral Reason  Judy [name], a new HCM patient, is referred for genetic consult and testing of hypertrophic cardiomyopathy.   Personal Medical Information Judy [name] (III.2 on pedigree) is a 58 year old lady of Native American/Caucasian/African American descent. In 2019, she went to the ER complaining of chest pains and upon initial evaluation was immediately hospitalized. Subsequent cardiac imaging studies detected cardiac wall thickness indicative of HCM. Since then, she has been followed up by Dr. Duke Salvia.  Her cardiac MRI (10/08/2019) detected asymmetric septal hypertrophy with IVS of 2.0 cm, LV - 0.6cm, LVEF of 70% and minimal scar burden of 2%.  Judy [name] reports being mostly asymptomatic with occasional fatigue. She denies dizziness, syncope, chest discomfort or dyspnea.  Traditional Risk Factors Judy [name] denies having HTN and states that she is on a beta blocker and fluid pill.  Family history  Relation to Proband Pedigree # Current age Heart condition/age of onset Notes  Daughters, 3 IV.4, IV.5, IV.6 50, 26, 22 None All underwent routine physical while playing sports at age 32/14. Recent Echo/EKG not done. The oldest lives in [place], middle is Scientific laboratory technician school at [employer] and will be working in [place] while the youngest is at a IT firm at [employer] III.3 56 None Echo/EKG to be done  Sisters, 2 III.1, III.4 Deceased, 55 III.1- 3 vessel CABG @ 22 III.1- Died @ 38 from heart issues III.4-Echo/EKG to be done  Nephew, nieces IV.1-IV.3, IV.7-IV.8 40-22 None   Maternal half siblings, 5 Not shown on pedigree        Father II.11 Deceased None Died @ 30s-diabetes  Paternal uncles, 4 II.7-II.10 Deceased, 80s None II.7-II.9- Died of ? @ ?  Paternal aunts, 6 II.1-II.6 Deceased, 80s None II.2-II.6- Died of ? @ ?  Paternal grandfather 1.1 Deceased None Died @ ? of ?  Paternal grandmother I.2 Deceased None Died @ ? of ?         Mother II.12 Deceased Heart murmur @ 30s Died suddenly at 88- found dead at the doorway of her room by her cousin. Autopsy not done.  Maternal aunts, 4 II.13-II.16 Deceased, 90s None II.14- Died in her sleep  Maternal uncles, 4 II.17-II.20 Deceased None II.17- Died @ 13 in MVA Others?  Maternal grandfather I.3 Deceased None Died @ 62s a year after his wife died  Maternal grandmother I.4 Deceased None Died of surgical complication from a brain tumor surgery at 1s   Genetics Judy [name] was counseled on the genetics of hypertrophic cardiomyopathy (HCM). I explained to the patient that this is an autosomal dominant condition with incomplete penetrance i.e. not all individuals harboring the HCM mutation will present clinically with HCM, and age-related penetrance where clinical presentation of HCM increases with advanced age. Variability in clinical expression is also seen in families with HCM with affected family members presenting clinically at different ages and with symptoms ranging from mild to severe.  Since HCM is an autosomal dominant condition, first degree-relatives are at a 50% risk of inheriting this condition. They should seek regular surveillance for HCM.  First-degree relatives include her three daughters, brother and sister. At this time her nephew and nieces do not need to undergo screening- they can be screened if they are symptomatic or if their parent is found to have HCM.    Clinical screening of first-degree relatives involves echocardiogram and EKG at regular intervals, frequency is typically determined by  age, with children undergoing screening every year until the age of 94 and those over the age of 59 getting screened every 3-5 years until the age of 32. Patient verbalized understanding of this.  Also briefly discussed the inheritance pattern and treatment /management plans for the infiltrative cardiomyopathies that present as HCM phenocopies. About 8-10% of HCM patients can have  compound and digenic sarcomeric mutations for HCM  Patient should be aware that genetic testing is a probabilistic test dependent upon age and severity of presentation, presence of risk factors for HCM and importantly family history of HCM or sudden death in first-degree relatives. The potential outcomes of genetic testing and subsequent management of at-risk family members is listed below-  If a mutation is not identified then it is important that he understands that HCM is a genetic condition and can be passed down to his children. All first-degree relatives should undergo regular screening for HCM.  A negative test result can be due to limitations of the genetic test.   There is also the likelihood of identifying a "Variant of unknown significance". This result means that the variant has not been detected in a statistically significant number of HCM patients and/or functional studies have not been performed to verify its pathogenicity. This VUS can be tested in the family to see if it segregates with disease. If a VUS is found, first-degree relatives should undergo regular clinical screening for HCM, but genetic testing for the VUS is otherwise not warranted.  If a pathogenic variant is reported, then first-degree family members can get tested for this variant. If they test positive, it is likely they will develop HCM. In light of variable expression and incomplete penetrance associated with HCM, it is not possible to predict when they will manifest clinically with HCM. It is recommended that family members that test positive for the familial pathogenic variant pursue clinical screening for HCM. Family members that test negative for the familial mutation need not pursue periodic screening for HCM, but seek care if symptoms develop.   Impression  Judy [name] was found to have cardiac wall thickness suggestive of HCM at age 37 in the absence of other cardiac loading conditions that can lead to cardiac  hypertrophy. While there is no family history of HCM, she reports two maternal relatives with sudden death. It is likely that she has inherited HCM from her mother who was not diagnosed with HCM but was found to have a heart murmur in her 1s.  Genetic testing is recommended to confirm her diagnosis. This test should include the major sarcomeric genes involved in HCM, namely MYBPPC3, MYH7, TNNI3, TNNT2, TPM1, ACTC1, MYL2 and MYL3. It should also include the genes involved in HCM phenocopies as cardiac-predominant forms of these conditions present clinically as HCM. These include genes for Fabry disease (GLA), Danon disease (LAMP2), WPW syndrome (PRKAG2), Familial transthyretin amyloidosis (TTR) and phospholamban (PLN).  In addition, patient should be aware of protections afforded by the Genetic Information Non-Discrimination Act (GINA). GINA protects a patient from losing their employment or health insurance based on their genotype. However, these protections do not cover life insurance and disability. Explained to the patient that family members that are found to have the familial genetic mutation will be denied life insurance even if they are asymptomatic and do not exhibit clinical signs of HCM. She verbalized understanding and will discuss this with her children and siblings.   Please note that the patient has not been counseled in this visit on other personal,  cultural or ethical issues that she may face due to her heart condition.   Plan After a thorough discussion of the risk and benefits of genetic testing for HCM, Judy [name] expresses interest in pursuing genetic testing for HCM and signed the informed consent. Blood was drawn today for HCM genetic testing.    Sidney Ace, Ph.D, [facility] Clinical Molecular Geneticist

## 2023-03-18 ENCOUNTER — Other Ambulatory Visit: Payer: Self-pay

## 2023-03-18 DIAGNOSIS — I421 Obstructive hypertrophic cardiomyopathy: Secondary | ICD-10-CM

## 2023-03-18 NOTE — Progress Notes (Signed)
 Lab order placed per Dr. Jomarie Longs.

## 2023-04-07 ENCOUNTER — Telehealth (HOSPITAL_COMMUNITY): Payer: Self-pay | Admitting: *Deleted

## 2023-04-07 NOTE — Telephone Encounter (Signed)
 Left message on voicemail per DPR in reference to upcoming appointment scheduled on 04/11/2023 at 2:30 with detailed instructions given per Stress Test Requisition Sheet for the test. LM to arrive 30 minutes early, and that it is imperative to arrive on time for appointment to keep from having the test rescheduled. If you need to cancel or reschedule your appointment, please call the office within 24 hours of your appointment. Failure to do so may result in a cancellation of your appointment, and a $50 no show fee. Phone number given for call back for any questions. Daneil Dolin

## 2023-04-11 ENCOUNTER — Ambulatory Visit (HOSPITAL_COMMUNITY)
Admission: RE | Admit: 2023-04-11 | Discharge: 2023-04-11 | Disposition: A | Discharge status: Home / Self Care | Source: Ambulatory Visit | Attending: Cardiology | Admitting: Cardiology

## 2023-04-11 ENCOUNTER — Ambulatory Visit (HOSPITAL_COMMUNITY)

## 2023-04-11 ENCOUNTER — Encounter (HOSPITAL_COMMUNITY): Payer: Self-pay

## 2023-04-11 ENCOUNTER — Encounter (HOSPITAL_COMMUNITY): Payer: Self-pay | Admitting: Radiology

## 2023-04-11 NOTE — Progress Notes (Signed)
 Judy [name] came for her stress echocardiogram today. Front desk noted the patient was out of network for insurance coverage. The patient was unaware, she would be responsible for the price of the stress echo and the professional fee. The patient originally signed for the stress echo because she was misinformed that she could file insurance after the test. Per her insurance policy, the procedure must be performed at an [facility] facility. A case # was noted in her appointment notes by pre-cert person but pre-cert person not here to explain if there is an ongoing case. I spoke with [facility] to verify if patient had coverage at the office. She said no. We gave the patient an estimate of her bill. She would be responsible for 100% of the bill, currently. She left without doing the test but she will be reaching out to her insurance company with the case # noted in her appointment desk to verify if she has any type of coverage. I informed her, I would be reaching out to you as well.

## 2023-04-12 NOTE — Progress Notes (Unsigned)
 Called pt to f/u testing that was canceled d/t insurance.  Pt reports has not reached out to insurance company to find out if we are in network.  Pt expresses will see about switching insurance to what previously had.  Advised pt if we aren't in network and [facility] is maybe better to switch care to [facility]. Pt reports would rather stay with [facility].    07/08/23 3:59 pm: left a message asking pt to give an update on status of insurance.  Dr. Santo ordered stress echocardiogram; Pt insurance only covers [facility].

## 2023-05-02 ENCOUNTER — Other Ambulatory Visit: Payer: Self-pay

## 2023-05-02 ENCOUNTER — Other Ambulatory Visit (HOSPITAL_BASED_OUTPATIENT_CLINIC_OR_DEPARTMENT_OTHER): Payer: Self-pay

## 2023-11-07 ENCOUNTER — Telehealth: Payer: Self-pay | Admitting: Cardiovascular Disease

## 2023-11-07 MED ORDER — VERAPAMIL HCL ER 120 MG PO TBCR: 120.0000 mg | EXTENDED_RELEASE_TABLET | Freq: Every day | ORAL | 0 refills | Status: DC

## 2023-11-07 MED ORDER — HYDROCHLOROTHIAZIDE 25 MG PO TABS: 25.0000 mg | ORAL_TABLET | Freq: Every day | ORAL | 0 refills | Status: DC

## 2023-11-07 NOTE — Telephone Encounter (Signed)
*  STAT* If patient is at the pharmacy, call can be transferred to refill team.   1. Which medications need to be refilled? (please list name of each medication and dose if known)   hydrochlorothiazide  (HYDRODIURIL ) 25 MG tablet   verapamil  (CALAN -SR) 120 MG CR tablet   2. Would you like to learn more about the convenience, safety, & potential cost savings by using the [facility]? no   3. Are you open to using the [facility] (Type [facility]. ).no   4. Which pharmacy/location (including street and city if local pharmacy) is medication to be sent to? [facility] - [place], [place] - [address]     5. Do they need a 30 day or 90 day supply? 90 day  Pt has an appt on 01/09/24

## 2023-11-07 NOTE — Telephone Encounter (Signed)
 Pt has appt in December with Catilin Walker, NP.  Refill for Verapamil  and hydrochlorothiazide  sent in to [facility] for 90 days.

## 2024-01-06 ENCOUNTER — Encounter (HOSPITAL_BASED_OUTPATIENT_CLINIC_OR_DEPARTMENT_OTHER): Payer: Self-pay

## 2024-01-09 ENCOUNTER — Encounter (HOSPITAL_BASED_OUTPATIENT_CLINIC_OR_DEPARTMENT_OTHER): Payer: Self-pay

## 2024-01-09 ENCOUNTER — Ambulatory Visit (HOSPITAL_BASED_OUTPATIENT_CLINIC_OR_DEPARTMENT_OTHER)

## 2024-01-09 VITALS — BP 128/76 | HR 88 | Ht 66.5 in | Wt 227.4 lb

## 2024-01-09 DIAGNOSIS — I10 Essential (primary) hypertension: Secondary | ICD-10-CM | POA: Diagnosis not present

## 2024-01-09 DIAGNOSIS — R9431 Abnormal electrocardiogram [ECG] [EKG]: Secondary | ICD-10-CM | POA: Diagnosis not present

## 2024-01-09 DIAGNOSIS — I272 Pulmonary hypertension, unspecified: Secondary | ICD-10-CM

## 2024-01-09 DIAGNOSIS — I421 Obstructive hypertrophic cardiomyopathy: Secondary | ICD-10-CM

## 2024-01-09 LAB — MAGNESIUM: Magnesium: 1.8 mg/dL (ref 1.6–2.3)

## 2024-01-09 LAB — BASIC METABOLIC PANEL WITH GFR
BUN/Creatinine Ratio: 14 (ref 9–23)
BUN: 13 mg/dL (ref 6–24)
CO2: 26 mmol/L (ref 20–29)
Calcium: 9.9 mg/dL (ref 8.7–10.2)
Chloride: 98 mmol/L (ref 96–106)
Creatinine, Ser: 0.94 mg/dL (ref 0.57–1.00)
Glucose: 104 mg/dL — ABNORMAL HIGH (ref 70–99)
Potassium: 3.8 mmol/L (ref 3.5–5.2)
Sodium: 140 mmol/L (ref 134–144)
eGFR: 70 mL/min/1.73

## 2024-01-09 NOTE — Patient Instructions (Signed)
 Medication Instructions:  Continue current medications *If you need a refill on your cardiac medications before your next appointment, please call your pharmacy*  Lab Work: Your provider recommends that you have lab work today: BMP and Mag If you have labs (blood work) drawn today and your tests are completely normal, you will receive your results only by: MyChart Message (if you have MyChart) OR A paper copy in the mail If you have any lab test that is abnormal or we need to change your treatment, we will call you to review the results.  Testing/Procedures:  Schedule stress echo ordered by Dr Santo  Follow-Up: At [facility], you and your health needs are our priority.  As part of our continuing mission to provide you with exceptional heart care, our providers are all part of one team.  This team includes your primary Cardiologist (physician) and Advanced Practice Providers or APPs (Physician Assistants and Nurse Practitioners) who all work together to provide you with the care you need, when you need it.  Your next appointment:    After stress echo  Provider:    Dr. Santo  We recommend signing up for the patient portal called MyChart.  Sign up information is provided on this After Visit Summary.  MyChart is used to connect with patients for Virtual Visits (Telemedicine).  Patients are able to view lab/test results, encounter notes, upcoming appointments, etc.  Non-urgent messages can be sent to your provider as well.   To learn more about what you can do with MyChart, go to [email or web address].

## 2024-01-09 NOTE — Progress Notes (Signed)
 " Cardiology Office Note   Date:  01/09/2024  ID:  YOALI CONRY, DOB Dec 11, 1965, MRN [id number] PCP: Norval Kettle, MD  [facility] Providers Cardiologist:  Annabella Scarce, MD     History of Present Illness Judy [name] is a 58 y.o. female with history of HOCM, hypertension, and prediabetes.    Initially evaluated in the hospital 01/2017 for atypical chest pain. Negative cardiac enzymes. EKG was concerning for hypertrophic cardiomyopathy or LVH with repolarization abnormalities. Workup was consistent with HOCM.  No family history of SCD. Cardiac MRI in 2021 shows asymptomatic septal hypertrophy. She did have short run of NSVT and started on metoprolol . Heart monitor 09/2020 showed predominant NSR, up to 4 beats NSVT, and up to 8 beats-SVT. Coronary CT 05/2021 cardiac calcium score of 0, normal coronary origins with right dominance, minimal noncalcified plaque in LCx/RCA, and small PFO.  She was last seen 03/14/2023 by Dr. Santo with reports of worsening fatigue and chest discomfort that have progressively worsened since 2021. She was referred to clinical genetics for genetic testing and stress echo ordered and not yet performed.    She presents today for her 6 month follow up. Her job requires lifting and standing. She had swelling in her bilateral ankles for around one year that was painful. This has since resolved and has not recurred. She does not check her BP at home. She denies chest pain, shortness of breath, lower extremity edema, fatigue, palpitations, melena, hematuria, hemoptysis, diaphoresis, weakness, presyncope, syncope, orthopnea, and PND.  ROS: All systems negative unless otherwise noted in HPI.  Studies Reviewed EKG Interpretation Date/Time:  Monday January 09 2024 13:50:17 EST Ventricular Rate:  89 PR Interval:  150 QRS Duration:  96 QT Interval:  394 QTC Calculation: 480 R Axis:   -28  Text Interpretation: Normal sinus rhythm Prolonged QT When compared  with ECG of 26-Oct-2022 08:25, T wave inversion less evident in Lateral leads Confirmed by Teresa Fish [phone]) on 01/09/2024 1:56:35 PM    Cardiac Studies & Procedures   ______________________________________________________________________________________________   STRESS TESTS  ECHOCARDIOGRAM STRESS TEST 02/04/2017  Interpretation Summary *[place]* *[facility]* [address] [place], [place] [address] [phone]  ------------------------------------------------------------------- Stress Echocardiography  Patient:    Judy [name], Judy [name] MR #:       [id number] Study Date: 02/04/2017 Gender:     F Age:        51 Height: Weight: BSA: Pt. Status: Room:       6E09C  SONOGRAPHER  Lyle Marc, RCS ADMITTING    Niu, Xilin [id number] ATTENDING    Ward, Josette SAILOR PERFORMING   [facility], Inpatient ORDERING     Annabella Scarce, MD  cc: Scarce Annabella  -------------------------------------------------------------------  ------------------------------------------------------------------- Indications:      (425.11).  ------------------------------------------------------------------- History:   PMH:   Dyspnea.  Hypertrophic cardiomyopathy.  ------------------------------------------------------------------- Study Conclusions  - Stress ECG conclusions: There were no stress arrhythmias or conduction abnormalities. The stress ECG was normal. The sensitivity of this test was limited by a failure to achieve target heart rate. - Staged echo: Findings are consistent with hypertrophic obstructive cardiomyopathy with a relatively mild inducible LVOT obstructive gradient. There is no evidence of exercise induced regional wall motion abnormalities. There is a small inducible (dynamic) LVOT gradient, maximum 36 mm Hg immediately after exercise.  Impressions:  - No evidence of exercise induced ischemia. Findings are consistent with hypertrophic  obstructive cardiomyopathy with a relatively mild inducible LVOT obstructive gradient.  ------------------------------------------------------------------- Study data:  Study status:  Routine.  Consent:  The risks, benefits, and alternatives to the procedure were explained to the patient and informed consent was obtained.  Procedure:  Initial setup. The patient was brought to the laboratory. A baseline ECG was recorded. Surface ECG leads and automatic cuff blood pressure measurements were monitored. Treadmill exercise testing was performed using the modified Bruce protocol. Exercise was terminated due to protocol completion. Transthoracic stress echocardiography for assessment of outflow obstruction. Image quality was adequate. Images were captured at baseline and peak exercise.  Study completion:  The patient tolerated the procedure well. There were no complications.          Modified Bruce protocol. Stress echocardiography.  Birthdate:  Patient birthdate: 23-May-1965.  Age:  Patient is 58 yr old.  Sex:  Gender: female. Blood pressure:     97/61  Patient status:  Inpatient.  Study date: Study date: 02/04/2017. Study time: 03:51 PM.  -------------------------------------------------------------------  ------------------------------------------------------------------- Stress protocol:  +--------+------------+--------+ !Stage   !BP (mmHg)   !Symptoms! +--------+------------+--------+ !Baseline!87/58 (68)  !None    ! +--------+------------+--------+ !Stage 3 !187/72 (110)![place]! +--------+------------+--------+  ------------------------------------------------------------------- Stress results:   Maximal heart rate during stress was 136 bpm (80% of maximal predicted heart rate). The maximal predicted heart rate was 169 bpm.The target heart rate was achieved. The heart rate response to stress was normal. There was a normal resting blood pressure with an appropriate response to  stress.  The patient experienced no chest pain during stress.   Functional capacity was normal.  ------------------------------------------------------------------- Stress ECG:  There were no stress arrhythmias or conduction abnormalities.  The stress ECG was normal. The sensitivity of this test was limited by a failure to achieve target heart rate.  ------------------------------------------------------------------- Immediate post stress:  - LV size was reduced. - LV global systolic function was hyperdynamic. The estimated LV ejection fraction was 80%. - Normal wall motion; no LV regional wall motion abnormalities.  ------------------------------------------------------------------- Stress echo results:     Left ventricular ejection fraction was normal at rest and with stress. Findings are consistent with hypertrophic obstructive cardiomyopathy with a relatively mild inducible LVOT obstructive gradient.  ------------------------------------------------------------------- Prepared and Electronically Authenticated by  Jerel Balding, MD 2019-01-25T17:34:35   ECHOCARDIOGRAM  ECHOCARDIOGRAM COMPLETE 12/01/2022  Narrative ECHOCARDIOGRAM REPORT    Patient Name:   Judy [name] Date of Exam: 12/01/2022 Medical Rec #:  [id number]      Height:       67.0 in Accession #:    [id number]     Weight:       265.2 lb Date of Birth:  12-May-1965       BSA:          2.280 m Patient Age:    57 years       BP:           118/70 mmHg Patient Gender: F              HR:           62 bpm. Exam Location:  [address]  Procedure: 2D Echo, Cardiac Doppler, Color Doppler, 3D Echo and Strain Analysis  Indications:    I42.2 HOCM  History:        Patient has prior history of Echocardiogram examinations, most recent 07/03/2019. Abnormal ECG, Signs/Symptoms:Chest Pain; Risk Factors:Hypertension. Prediabetes. Obesity.  Sonographer:    Jon Hacker RCS Referring Phys: [id number] [facility]  CHANDRASEKHAR  IMPRESSIONS   1. Left ventricular ejection fraction, by estimation, is 60 to 65%. The  left ventricle has normal function. The left ventricle has no regional wall motion abnormalities. There is severe asymmetric left ventricular hypertrophy of the septal segment. No significant LV outflow tract gradient measured. Left ventricular diastolic parameters are consistent with Grade II diastolic dysfunction (pseudonormalization). The average left ventricular global longitudinal strain is -21.9 %. The global longitudinal strain is normal. 2. Right ventricular systolic function is normal. The right ventricular size is normal. There is normal pulmonary artery systolic pressure. The estimated right ventricular systolic pressure is 16.5 mmHg. 3. No mitral valve systolic anterior motion noted. The mitral valve is normal in structure. Mild mitral valve regurgitation. No evidence of mitral stenosis. 4. The aortic valve is tricuspid. Aortic valve regurgitation is trivial. 5. The inferior vena cava is normal in size with greater than 50% respiratory variability, suggesting right atrial pressure of 3 mmHg.  FINDINGS Left Ventricle: Left ventricular ejection fraction, by estimation, is 60 to 65%. The left ventricle has normal function. The left ventricle has no regional wall motion abnormalities. The average left ventricular global longitudinal strain is -21.9 %. The global longitudinal strain is normal. The left ventricular internal cavity size was normal in size. There is severe asymmetric left ventricular hypertrophy of the septal segment. Left ventricular diastolic parameters are consistent with Grade II diastolic dysfunction (pseudonormalization).  Right Ventricle: The right ventricular size is normal. No increase in right ventricular wall thickness. Right ventricular systolic function is normal. There is normal pulmonary artery systolic pressure. The tricuspid regurgitant velocity is 1.84 m/s,  and with an assumed right atrial pressure of 3 mmHg, the estimated right ventricular systolic pressure is 16.5 mmHg.  Left Atrium: Left atrial size was normal in size.  Right Atrium: Right atrial size was normal in size.  Pericardium: There is no evidence of pericardial effusion.  Mitral Valve: No mitral valve systolic anterior motion noted. The mitral valve is normal in structure. Mild mitral valve regurgitation. No evidence of mitral valve stenosis.  Tricuspid Valve: The tricuspid valve is normal in structure. Tricuspid valve regurgitation is trivial.  Aortic Valve: The aortic valve is tricuspid. Aortic valve regurgitation is trivial.  Pulmonic Valve: The pulmonic valve was normal in structure. Pulmonic valve regurgitation is trivial.  Aorta: The aortic root is normal in size and structure.  Venous: The inferior vena cava is normal in size with greater than 50% respiratory variability, suggesting right atrial pressure of 3 mmHg.  IAS/Shunts: No atrial level shunt detected by color flow Doppler.   LEFT VENTRICLE PLAX 2D LVIDd:         4.10 cm   Diastology LVIDs:         2.70 cm   LV e' medial:    5.98 cm/s LV PW:         1.50 cm   LV E/e' medial:  12.7 LV IVS:        1.60 cm   LV e' lateral:   10.20 cm/s LVOT diam:     2.10 cm   LV E/e' lateral: 7.5 LV SV:         81 LV SV Index:   36        2D Longitudinal Strain LVOT Area:     3.46 cm  2D Strain GLS Avg:     -21.9 %  3D Volume EF: 3D EF:        63 % LV EDV:       310 ml LV ESV:       115 ml LV  SV:        195 ml  RIGHT VENTRICLE RV S prime:     11.60 cm/s TAPSE (M-mode): 2.5 cm RVSP:           16.5 mmHg  LEFT ATRIUM             Index        RIGHT ATRIUM           Index LA diam:        4.10 cm 1.80 cm/m   RA Pressure: 3.00 mmHg LA Vol (A2C):   40.9 ml 17.94 ml/m  RA Area:     13.80 cm LA Vol (A4C):   58.0 ml 25.44 ml/m  RA Volume:   26.10 ml  11.45 ml/m LA Biplane Vol: 51.2 ml 22.46 ml/m AORTIC VALVE LVOT  Vmax:   99.00 cm/s LVOT Vmean:  60.200 cm/s LVOT VTI:    0.235 m  AORTA Ao Root diam: 3.10 cm Ao Asc diam:  3.40 cm  MITRAL VALVE               TRICUSPID VALVE MV Area (PHT): 2.76 cm    TR Peak grad:   13.5 mmHg MV Decel Time: 275 msec    TR Vmax:        184.00 cm/s MV E velocity: 76.00 cm/s  Estimated RAP:  3.00 mmHg MV A velocity: 70.20 cm/s  RVSP:           16.5 mmHg MV E/A ratio:  1.08 SHUNTS Systemic VTI:  0.24 m Systemic Diam: 2.10 cm  Dalton McleanMD Electronically signed by Ezra Kanner Signature Date/Time: 12/01/2022/5:00:07 PM    Final    MONITORS  LONG TERM MONITOR (3-14 DAYS) 10/07/2020  Narrative 3 Day Zio Monitor  Quality: Fair.  Baseline artifact. Predominant rhythm: sinus rhythm Average heart rate: 60 bpm Max heart rate: 130 bpm Min heart rate: 82 bpm Pauses >2.5 seconds: none  Up to 4 beats NSVT Up to 8 beats SVT Rare PVCs  Tiffany C. Raford, MD, [facility] 10/07/2020 12:45 PM   CT SCANS  CT CORONARY MORPH W/CTA COR W/SCORE 06/01/2021  Addendum 06/01/2021  1:07 PM ADDENDUM REPORT: 06/01/2021 13:05  CLINICAL DATA:  Chest pain  EXAM: Cardiac/Coronary CTA  TECHNIQUE: A non-contrast, gated CT scan was obtained with axial slices of 3 mm through the heart for calcium scoring. Calcium scoring was performed using the Agatston method. A 110 kV prospective, gated, contrast cardiac scan was obtained. Gantry rotation speed was 250 msecs and collimation was 0.6 mm. Two sublingual nitroglycerin  tablets (0.8 mg) were given. The 3D data set was reconstructed in 5% intervals of the 35-75% of the R-R cycle. Diastolic phases were analyzed on a dedicated workstation using MPR, MIP, and VRT modes. The patient received 95 cc of contrast.  FINDINGS: Image quality: Excellent.  Noise artifact is: Limited.  Coronary Arteries:  Normal coronary origin.  Right dominance.  Left main: The left main is a large caliber vessel with a normal take off from the  left coronary cusp that bifurcates to form a left anterior descending artery and a left circumflex artery. There is no plaque or stenosis.  Left anterior descending artery: The LAD is patent without evidence of plaque or stenosis. The LAD gives off 2 patent diagonal branches.  Left circumflex artery: The LCX is non-dominant. There is minimal non-calcified plaque (<25%). The LCX gives off 2 patent obtuse marginal branches.  Right coronary artery: The RCA is dominant with normal take  off from the right coronary cusp. There is minimal non-calcified plaque (<25%). The RCA terminates as a PDA and right posterolateral branch without evidence of plaque or stenosis.  Right Atrium: Right atrial size is within normal limits.  Right Ventricle: The right ventricular cavity is within normal limits.  Left Atrium: Left atrial size is normal in size with no left atrial appendage filling defect. Small PFO.  Left Ventricle: The ventricular cavity size is within normal limits.  Pulmonary arteries: Normal in size without proximal filling defect.  Pulmonary veins: Normal pulmonary venous drainage.  Pericardium: Normal thickness without significant effusion or calcium present.  Cardiac valves: The aortic valve is trileaflet without significant calcification. The mitral valve is normal without significant calcification.  Aorta: Normal caliber without significant disease.  Extra-cardiac findings: See attached radiology report for non-cardiac structures.  IMPRESSION: 1. Coronary calcium score of 0.  2. Normal coronary origin with right dominance.  3. Minimal non-calcified plaque (<25%) in the LCX/RCA.  4. Small PFO.  RECOMMENDATIONS: 1. Minimal non-obstructive CAD (0-24%). Consider non-atherosclerotic causes of chest pain. Consider preventive therapy and risk factor modification.  Darryle Decent, MD   Electronically Signed By: Darryle Decent M.D. On: 06/01/2021  13:05  Narrative EXAM: OVER-READ INTERPRETATION  CT CHEST  The following report is a limited chest CT over-read performed by radiologist Dr. Elsie Shoulder of [facility] on 06/01/2021. This over-read does not include interpretation of cardiac or coronary anatomy or pathology. The coronary calcium score/coronary CTA interpretation by the cardiologist is attached.  COMPARISON:  None Available.  FINDINGS: Vascular: There are no significant vascular findings.  Mediastinum/Nodes: There are no enlarged lymph nodes.The visualized esophagus demonstrates no significant findings.  Lungs/Pleura: Clear lungs. No pneumothorax or pleural effusion.  Upper abdomen: No acute abnormality.  Musculoskeletal/Chest wall: No chest wall abnormality. No acute or significant osseous findings.  IMPRESSION: 1. No significant extracardiac findings.  Electronically Signed: By: Elsie ONEIDA Shoulder M.D. On: 06/01/2021 12:47   CARDIAC MRI  MR CARDIAC MORPHOLOGY W WO CONTRAST 10/08/2019  Narrative CLINICAL DATA:  HCM evaluation  EXAM: CARDIAC MRI  TECHNIQUE: The patient was scanned on a 1.5 Tesla Siemens magnet. A dedicated cardiac coil was used. Functional imaging was done using Fiesta sequences. 2,3, and 4 chamber views were done to assess for RWMA's. Modified Simpson's rule using a short axis stack was used to calculate an ejection fraction on a dedicated work [employer]. The patient received 10 cc of Gadavist . After 10 minutes inversion recovery sequences were used to assess for infiltration and scar tissue.  CONTRAST:  10 cc  of Gadavist   FINDINGS: Left ventricle:  -Asymmetric hypertrophy measuring up to 20mm in mid inferoseptum (6mm in posterior wall)  -Normal size  -Hyperdynamic systolic function  -Normal ECV (26%, using most recent Hct 45%)  -RV insertion site LGE. LGE accounts for 2% of total myocardial mass  LV EF:  70% (Normal  56-78%)  Absolute volumes:  LV EDV: (Normal 52-141 mL)  LV ESV: 49mL (Normal 13-51 mL)  LV SV: (Normal 33-97 mL)  CO: 6.3L/min (Normal 2.7-6.0 L/min)  Indexed volumes:  LV EDV: 77mL/sq-m (Normal 41-81 mL/sq-m)  LV ESV: 41mL/sq-m (Normal 12-21 mL/sq-m)  LV SV: 51mL/sq-m (Normal 26-56 mL/sq-m)  CI: 2.8L/min/sq-m (Normal 1.8-3.8 L/min/sq-m)  Right ventricle: Normal size and systolic function  RV EF: 66% (Normal 47-80%)  Absolute volumes:  RV EDV: (Normal 58-154 mL)  RV ESV: 53mL (Normal 12-68 mL)  RV SV: (Normal 35-98  mL)  CO: 5.9L/min (Normal 2.7-6 L/min)  Indexed volumes:  RV EDV: 51mL/sq-m (Normal 48-87 mL/sq-m)  RV ESV: 35mL/sq-m (Normal 11-28 mL/sq-m)  RV SV: 63mL/sq-m (Normal 27-57 mL/sq-m)  CI: 2.6L/min/sq-m (Normal 1.8-3.8 L/min/sq-m)  Left atrium: Moderate enlargement  Right atrium: Mild enlargement  Mitral valve: Mild to moderate regurgitation (regurgitant fraction 20%)  Aortic valve: No regurgitation  Tricuspid valve: Mild regurgitation (regurgitant fraction 16%)  Pulmonic valve: No regurgitation  Aorta: Normal proximal ascending aorta  Pericardium: Normal  IMPRESSION: 1. Asymmetric LV hypertrophy measuring up to 20mm in mid inferoseptum (6mm in posterior wall), consistent with hypertrophic cardiomyopathy  2. RV insertion site late gadolinium enhancement, consistent with HCM. LGE accounts for 2% of total myocardial mass  3.  Normal LV size with hyperdynamic systolic function (EF 70%)  4.  Normal RV size and systolic function (EF 66%)  5.  Mild to moderate mitral regurgitation (regurgitant fraction 20%)  6.  Mild tricuspid regurgitation (regurgitant fraction 16%)   Electronically Signed By: Lonni Nanas MD On: 10/08/2019 20:58   ______________________________________________________________________________________________      Risk Assessment/Calculations           Physical Exam VS:   BP 128/76   Pulse 88   Ht 5' 6.5 (1.689 m)   Wt 227 lb 6.4 oz (103.1 kg)   SpO2 98%   BMI 36.15 kg/m        Wt Readings from Last 3 Encounters:  01/09/24 227 lb 6.4 oz (103.1 kg)  03/14/23 252 lb 9.6 oz (114.6 kg)  03/04/23 245 lb (111.1 kg)    GEN: Well nourished, well developed in no acute distress NECK: No JVD; No carotid bruits CARDIAC: RRR, no murmurs, rubs, gallops RESPIRATORY:  Clear to auscultation without rales, wheezing or rhonchi  ABDOMEN: Soft, non-tender, non-distended EXTREMITIES:  No edema; No deformity   ASSESSMENT AND PLAN  HOCM - Established care with Dr. Santo 03/14/23. Genetic testing and stress echo ordered. She was unable to have stress echo due to insurance coverage. She has since changed insurances and is going to call to schedule stress echo. SOB improvement likely d/t ~30 lb weight loss. She denies CP, SOB, fatigue, dizziness, syncope, and palpitations. Continue verapamil  120 mg.   Pulmonary Hypertension- PA dilation ~32 mm. No evidence of PA hypertension. Has lost ~30 lb since last visit. Denies SOB and DOE. Recommend aiming for 150 minutes of moderate intensity activity per week and following a heart healthy diet.   Prolonged QT interval- EKG today showed slight prolonged QT interval. Will check BMP and Mag.   HTN- Not taking her BP at home. Discussed to monitor BP at home at least 2 hours after medications and sitting for 5-10 minutes. Continue hydrochlorothiazide  25 mg and verapamil  120 mg daily.   Prediabetes- Last A1c 5.4 07/21/21. Encouraged follow up with PCP.        Dispo: Follow up with Dr. Santo after stress echo.   Signed, Mardy KATHEE Pizza, FNP  "

## 2024-01-10 ENCOUNTER — Ambulatory Visit: Payer: Self-pay

## 2024-01-25 ENCOUNTER — Telehealth (HOSPITAL_COMMUNITY): Payer: Self-pay | Admitting: *Deleted

## 2024-01-25 NOTE — Telephone Encounter (Signed)
 Left detailed instructions for stress echo.

## 2024-02-02 ENCOUNTER — Ambulatory Visit (HOSPITAL_COMMUNITY)
Admission: RE | Admit: 2024-02-02 | Discharge: 2024-02-02 | Disposition: A | Discharge status: Home / Self Care | Source: Ambulatory Visit | Attending: Internal Medicine | Admitting: Internal Medicine

## 2024-02-02 ENCOUNTER — Ambulatory Visit (HOSPITAL_COMMUNITY)
Admission: RE | Admit: 2024-02-02 | Discharge: 2024-02-02 | Disposition: A | Discharge status: Home / Self Care | Source: Ambulatory Visit | Attending: Cardiology | Admitting: Cardiology

## 2024-02-02 DIAGNOSIS — I421 Obstructive hypertrophic cardiomyopathy: Secondary | ICD-10-CM | POA: Diagnosis present

## 2024-02-03 LAB — ECHOCARDIOGRAM STRESS TEST
Area-P 1/2: 3.29 cm2
MV M vel: 4.92 m/s
MV Peak grad: 96.8 mmHg
Radius: 0.73 cm
S' Lateral: 2.2 cm

## 2024-02-04 ENCOUNTER — Ambulatory Visit: Payer: Self-pay | Admitting: Internal Medicine

## 2024-02-07 ENCOUNTER — Other Ambulatory Visit: Payer: Self-pay | Admitting: Cardiovascular Disease
